# Patient Record
Sex: Female | Born: 1990 | Race: Black or African American | Hispanic: No | Marital: Single
Health system: Midwestern US, Community
[De-identification: ages and names within clinical notes are randomized; demographics above are authoritative.]

## PROBLEM LIST (undated history)

## (undated) DIAGNOSIS — Z789 Other specified health status: Secondary | ICD-10-CM

---

## 2016-08-04 ENCOUNTER — Inpatient Hospital Stay: Admit: 2016-08-04 | Discharge: 2016-08-04 | Disposition: A | Discharge status: Home / Self Care

## 2016-08-04 DIAGNOSIS — Z202 Contact with and (suspected) exposure to infections with a predominantly sexual mode of transmission: Secondary | ICD-10-CM

## 2016-08-04 LAB — WET PREP, GENITAL
Clue Cells, Wet Prep: NONE SEEN
Trichomonas Prep: NONE SEEN
Yeast, Wet Prep: NONE SEEN

## 2016-08-04 LAB — URINALYSIS WITH REFLEX TO CULTURE
Bilirubin Urine: NEGATIVE
Glucose, Ur: NEGATIVE mg/dL
Ketones, Urine: NEGATIVE mg/dL
Nitrite, Urine: NEGATIVE
Protein, UA: 30 mg/dL — AB
Specific Gravity, UA: 1.023 (ref 1.005–1.030)
Urobilinogen, Urine: 1 E.U./dL (ref <2.0)
pH, UA: 6.5 (ref 5.0–8.0)

## 2016-08-04 LAB — C.TRACHOMATIS N.GONORRHOEAE DNA
C. trachomatis DNA: NEGATIVE
N. gonorrhoeae DNA: NEGATIVE

## 2016-08-04 LAB — MICROSCOPIC URINALYSIS
Epithelial Cells, UA: 6 /HPF — ABNORMAL HIGH (ref 0–5)
Hyaline Casts, UA: 12 /LPF — ABNORMAL HIGH (ref 0–8)
RBC, UA: 12 /HPF — ABNORMAL HIGH (ref 0–4)
WBC, UA: 33 /HPF — ABNORMAL HIGH (ref 0–5)

## 2016-08-04 LAB — PREGNANCY, URINE: HCG(Urine) Pregnancy Test: NEGATIVE

## 2016-08-04 MED ORDER — CEFTRIAXONE SODIUM 250 MG IJ SOLR
250 MG | Freq: Once | INTRAMUSCULAR | Status: AC
Start: 2016-08-04 — End: 2016-08-04
  Administered 2016-08-04: 14:00:00 250 mg via INTRAMUSCULAR

## 2016-08-04 MED ORDER — AZITHROMYCIN 500 MG PO TABS
500 MG | Freq: Once | ORAL | Status: AC
Start: 2016-08-04 — End: 2016-08-04
  Administered 2016-08-04: 14:00:00 1000 mg via ORAL

## 2016-08-04 MED FILL — CEFTRIAXONE SODIUM 250 MG IJ SOLR: 250 MG | INTRAMUSCULAR | Qty: 250

## 2016-08-04 MED FILL — AZITHROMYCIN 500 MG PO TABS: 500 MG | ORAL | Qty: 2

## 2016-08-04 NOTE — Discharge Instructions (Signed)
STD Clinics  FOR MORE INFORMATION ABOUT STD?S, CONTACT YOUR PHYSICIAN OR:    Sexually Transmitted Disease [facility]                              [facility]                              [address]                               [place], [place]  [id number]                    [phone]    Sexually Transmitted Disease Clinic                              [facility]                              [address]                     [place], [place]  [id number]                    [phone]    [facility]                  [address]                   [place], [place]  [id number]                                           [phone]  [phone]                      [place] AIDS Hotline     [phone]               [facility] Referral number [phone] for Primary Care      [facility]  [facility]  [address] [address]  [phone]  Fax [phone]  Medical, OB/Gyn, Pediatrics, WIC  Serves all of [facility])  [address]  [place], [place] [address]  [phone]       [facility]  [address] (Administrative offices)  [phone]  Homeless only [facility])  [address], [place], [place] [address]  [phone] or [phone], Spanish [phone],   Dental Appointments [phone] or [phone]  Pediatric, Family Practice, X-Ray  Serves all of [facility]     [facility])  [address]  [place], [place] [address]  [phone]   Health Care Connection [phone](Mt. Healthy)   [address]    (Located in [facility])  [phone] or [phone], Spanish [phone],   Dental Appointments [phone] or [phone]     [facility]  [address]  [place], [place] [address]  [phone] [facility]  [address]  [phone]   [facility]  [address]  [id number]  [phone] Fax [phone]  General Practice    [facility] [place] and Surrounding areas [facility]  [address] [address]  [phone] Fax [phone]  Medical, OB/Gyn, Pediatrics  Dental Clinic, [facility] [place] limits only     [facility]  [address] [address]  [phone] Fax [phone]  [facility], Pediatrics, [facility]OB/Gyn, [facility]  Dental Clinic [phone]  [facility] [place] area   [facility]  [address]    [phone]  Urgent Care, Open 24 hrs, Urgent care, Gyn, Prenatal, Dental Mental, Translators     [facility]   [address] [place], [place] [address] [phone]  (Located: [facility])  Pediatrics [phone], Spanish [phone],   Dental Appointments [phone] or [phone]  [facility] [phone] [facility]  [address] [address]  [phone]  Medical, OB/Gyn, Pediatrics, [facility]  Dental Clinic [phone]       [facility]  [address], [place], [place]  [address]  [phone] Fax [phone]  Pediatrics, [facility]   [facility]  [address] [id number]  [phone]   Fax [phone]  Pediatrics, [facility]     [facility]  [address] [address]  [phone]  Medical Clinic   [facility]  [address] [address]  [phone]  Pediatrics, Internal Med, [facility]oOB/Gyn, [facility], Dental Clinic [phone]     [facility]  [address]  [place] [address]   [phone]  [facility]  [address] [address]  [phone] Fax [phone]  General Medical Clinic  Sliding scale fee  All [place] area     HOSPITAL CLINICS    [facility]  [address]  [place], [place] [id number]  [phone]     [facility]  [address]  [place], [place] [address]  [phone]    [facility]   [address]  [place], [place] [address]  [phone]         [facility]  [address]  [place], [place] [address]  [facility]  [phone]  Fax:  [phone]  [facility] and Pediatrics  [phone]  Fax:  [phone]  General Medical Clinic  Resident Practice  [phone]  Fax:  [phone]  Medical Specialty Center  [phone]  Fax:  [phone]  Orthopaedics  [phone]     [facility]    [facility] [place])  [address]  [place], [place] [address]  [phone]    [place]ella GroveLER[place]ONT (Continued)    [facility]   [facility] [place])  [address]  [place], [place] [address]  [phone]   [facility]    [facility] [place]   (formerly [facility])   [address]  [place], [place] [address]  [phone]  [phone] OB/GYN Services   [facility]   [facility] Source [facility] [place])  [address]  [place], [place] [address]  [phone]       [facility]  [facility] [place])  [phone] [place]. [address]  [place], [place] [address]  [phone] Clarisa SchoolsFAYETTE COUNTY    [facility]  ([facility] [place])  [address]  [place], [place] [address]  [phone]     [facility]  [facility] Source of [place])  [address]  [place], [place] [address]  [phone]   [facility]    [place] [facility] ([facility] Source [facility] [place])  [address]  [place], [place] [address]  [phone]    [facility]  [facility]  [address], [place], [place] [address]  [phone]  General Family Practice, Pediatrics  Services all areas sliding scale fee     [facility]  Pleas PatriciaWar[place] Co. Health Department  [address] [place], [place]  [address]  [phone]  General Medical Clinic, Pediatrics, [facility]WIC   [facility]   (formerly [facility])  [address]  [place], [place] [id number]  [phone]   [facility] (formerly Micron TechnologyM[facility]let[facility] Social [facility])  [address]  [place], [place] [address]  [phone]     [place]  [facility] ([facility] Source [facility] [place])  [address]   [place] [facility]  [phone]  Gyn, Prenatal, Dental, Mental, Translators [place]  [facility]  [place]   [phone]         - Abstain from intercourse for at least 2 weeks  - Follow up with [facility] for blood work   - Return to the ED with any new or worrisome symptoms

## 2016-08-04 NOTE — ED Notes (Signed)
Pt signed and dc instructions given. Pt was in hurry and unable to listen to RN directions. Pt dc at this time.      Charleston Ropesrystal Cinthia Rodden, RN  08/04/16 1010

## 2016-08-04 NOTE — Progress Notes (Signed)
Culture Result reviewed, no further treatment needed.

## 2016-08-04 NOTE — Progress Notes (Signed)
Culture result reviewed, no further treatment needed.

## 2016-08-04 NOTE — ED Provider Notes (Signed)
[facility] EMERGENCY DEPT  [address] [place] [address]  Dept: [phone]  Loc: [phone]    eMERGENCY dEPARTMENT eNCOUnter        CHIEF COMPLAINT    Chief Complaint   Patient presents with   . Pregnancy Test   . Exposure to STD     no symptoms, exposed but unknown       HPI    Katelyn Castaneda is a 26 y.o. female who presents with concern for possible STD and requesting a pregnancy test.  She has no symptoms.  She states she is here because her significant other is here and is being tested as well.  She denies any abdominal pain, vaginal discharge.  She denies any sores or rashes.  She denies any fever or chills.  She has no further complaints at this time.    REVIEW OF SYSTEMS    General: No fevers  GU: no dysuria, no GU discharge  GI: No vomiting, no abdominal pain  Skin: No new rashes  Musculoskeletal: No arthralgia    PAST MEDICAL & SURGICAL HISTORY    History reviewed. No pertinent past medical history.  Past Surgical History:   Procedure Laterality Date   . CESAREAN SECTION         CURRENT MEDICATIONS  (may include discharge medications prescribed in the ED)      ALLERGIES    No Known Allergies    FAMILY AND SOCIAL HISTORY    History reviewed. No pertinent family history.  Social History     Social History   . Marital status: Single     Spouse name: N/A   . Number of children: N/A   . Years of education: N/A     Social History Main Topics   . Smoking status: Never Smoker   . Smokeless tobacco: None   . Alcohol use Yes      Comment: social   . Drug use: No   . Sexual activity: Yes     Other Topics Concern   . None     Social History Narrative   . None       PHYSICAL EXAM    VITAL SIGNS: BP 115/66   Pulse 81   Temp 98.3 F (36.8 C) (Oral)   Resp 14   Ht 4\' 6"  (1.372 m)   Wt 146 lb 13.2 oz (66.6 kg)   LMP 07/08/2016   SpO2 100%   BMI 35.40 kg/m    Constitutional:  Well-developed, appears comfortable  HENT:  Atraumatic, external nose normal  NECK:  Supple  Respiratory:  No respiratory distress  Cardiovascular: no JVD  GI:  Abdomen is non-distended,  no abdominal tenderness  Lymphatic: No bubo or inguinal lymphadenopathy  GU:  Pelvic exam performed with chaperone reveals: Normally developed external genitalia with no cutaneous or mucosal lesions or vesicles, normal appearing cervix, with some white discharge in the vaginal vault, no cervical motion tenderness, os is closed and no active bleeding from the cervix  Back: no CVA tenderness  Integument:  Nondiaphoretic skin, no obvious rashes  Musculoskeletal: No obvious joint swelling or limitations of joints range of motion  Neurologic: Awake and oriented, no slurred speech    ED COURSE & MEDICAL DECISION MAKING    See chart for details of medications given.    Differential diagnosis: UTI, Pyelonephritis, Chlamydia/Gonorrhea, Bacterial Vaginosis, Yeast vaginitis, Trichomonas, Herpes genitalia, Venereal Warts (condyloma acuminata), Syphilis, HIV/AIDS, Chancroid (Haemophilus ducreyi), Lymphogranuloma venereum, Granuloma  inguinale    Patient is afebrile and nontoxic in appearance.  No evidence of PID on pelvic exam.  She was treated in the ED empirically with Rocephin and Zithromax.  Wet prep negative, negative for trichomonas.  Urinalysis with no evidence for acute infection -the leukocytes and white blood cells are likely due to the vaginal discharge and potential STD, we will see what the culture shows.  She is not pregnant.      I instructed the patient to follow up as an outpatient in 2 days.  I instructed the patient to have an outpatient HIV and Syphilis test, to be ordered by the follow-up doctor or at a local clinic.  I will provide a list of local clinics with the discharge instructions.  I instructed the patient not to have sex for 2 weeks.  I instructed the patient to return to the ED immediately for any new or worsening symptoms.  The patient verbalizes understanding.    This was an independent mid-level  encounter.     FINAL IMPRESSION    1. Possible exposure to STD        PLAN  Discharge with outpatient follow-up    (Please note that this note was completed with a voice recognition program.  Every attempt was made to edit the dictations, but inevitably there remain words that are mis-transcribed.)         Burman FreestoneBrook M Tabor Bartram, [place]  08/04/16 [phone]

## 2016-08-04 NOTE — ED Triage Notes (Signed)
Pt presented to ED d/t exposure of unknown STD. Pt states she has no symptoms. Pt also presented to ED for pregnancy test.

## 2016-08-05 LAB — CULTURE, URINE: Urine Culture, Routine: >50000

## 2019-04-28 HISTORY — PX: CHOLECYSTECTOMY: SHX55

## 2020-11-08 ENCOUNTER — Inpatient Hospital Stay (HOSPITAL_COMMUNITY)
Admission: AD | Admit: 2020-11-08 | Discharge: 2020-11-08 | Disposition: A | Discharge status: Home / Self Care | Payer: Medicaid Other | Attending: Family Medicine | Admitting: Family Medicine

## 2020-11-08 ENCOUNTER — Other Ambulatory Visit: Payer: Self-pay

## 2020-11-08 ENCOUNTER — Inpatient Hospital Stay (HOSPITAL_COMMUNITY): Payer: Medicaid Other

## 2020-11-08 ENCOUNTER — Encounter (HOSPITAL_COMMUNITY): Payer: Self-pay | Admitting: Family Medicine

## 2020-11-08 DIAGNOSIS — O209 Hemorrhage in early pregnancy, unspecified: Secondary | ICD-10-CM

## 2020-11-08 DIAGNOSIS — O200 Threatened abortion: Secondary | ICD-10-CM | POA: Insufficient documentation

## 2020-11-08 DIAGNOSIS — Z3A00 Weeks of gestation of pregnancy not specified: Secondary | ICD-10-CM | POA: Diagnosis not present

## 2020-11-08 DIAGNOSIS — Z3A01 Less than 8 weeks gestation of pregnancy: Secondary | ICD-10-CM | POA: Diagnosis not present

## 2020-11-08 LAB — URINALYSIS, ROUTINE W REFLEX MICROSCOPIC
Bilirubin Urine: NEGATIVE
Glucose, UA: NEGATIVE mg/dL
Hgb urine dipstick: NEGATIVE
Ketones, ur: NEGATIVE mg/dL
Leukocytes,Ua: NEGATIVE
Nitrite: NEGATIVE
Protein, ur: NEGATIVE mg/dL
Specific Gravity, Urine: 1.02 (ref 1.005–1.030)
pH: 6 (ref 5.0–8.0)

## 2020-11-08 LAB — CBC
HCT: 39.3 % (ref 36.0–46.0)
Hemoglobin: 13.1 g/dL (ref 12.0–15.0)
MCH: 31.1 pg (ref 26.0–34.0)
MCHC: 33.3 g/dL (ref 30.0–36.0)
MCV: 93.3 fL (ref 80.0–100.0)
Platelets: 278 10*3/uL (ref 150–400)
RBC: 4.21 MIL/uL (ref 3.87–5.11)
RDW: 13.9 % (ref 11.5–15.5)
WBC: 7.1 10*3/uL (ref 4.0–10.5)
nRBC: 0 % (ref 0.0–0.2)

## 2020-11-08 LAB — HCG, QUANTITATIVE, PREGNANCY: hCG, Beta Chain, Quant, S: 16909 m[IU]/mL — ABNORMAL HIGH (ref <5)

## 2020-11-08 LAB — ABO/RH: ABO/RH(D): A POS

## 2020-11-08 LAB — POCT PREGNANCY, URINE: Preg Test, Ur: POSITIVE — AB

## 2020-11-08 NOTE — MAU Note (Signed)
Hailey Watson is a 30 y.o. at [redacted]w[redacted]d here in MAU reporting: pt arrived via EMS complaining of abdominal pain. States the pain started after eating ginger cookies and milk yesterday. Pain is intermittent. Started having some vaginal bleeding this AM. States it was pink and she saw it when she wiped.   LMP: 10/01/20  Onset of complaint: yesterday  Pain score: 6/10  Vitals:   11/08/20 1014  BP: 115/69  Pulse: 78  Resp: 16  Temp: 98.9 F (37.2 C)  SpO2: 100%     Lab orders placed from triage: UA, UPT

## 2020-11-08 NOTE — MAU Provider Note (Signed)
   S Ms. Hailey Watson is a 30 y.o. G1P0 patient who presents to MAU today with complaint of cramping and light spotting that started earlier today and is now resolved.   O BP 115/69 (BP Location: Right Arm)   Pulse 78   Temp 98.9 F (37.2 C) (Oral)   Resp 16   Ht 4\' 6"  (1.372 m)   Wt 72.3 kg   LMP 10/01/2020 (Approximate)   SpO2 100% Comment: room air  BMI 38.43 kg/m  Physical Exam Vitals reviewed.  Constitutional:      Appearance: She is well-developed.  HENT:     Head: Normocephalic and atraumatic.  Cardiovascular:     Rate and Rhythm: Normal rate and regular rhythm.  Abdominal:     General: Abdomen is flat. Bowel sounds are increased.     Palpations: Abdomen is rigid.     Tenderness: There is no abdominal tenderness. There is no guarding or rebound.  Neurological:     Mental Status: She is alert.    A Medical screening exam complete 1. Threatened miscarriage   2. First trimester bleeding      P Discharge from MAU in stable condition F/u 12/02/2020 in 10 days. Warning signs for worsening condition that would warrant emergency follow-up discussed Patient may return to MAU as needed   [place], DO 11/08/2020 12:17 PM

## 2020-11-18 ENCOUNTER — Inpatient Hospital Stay: Admission: RE | Admit: 2020-11-18 | Payer: Medicaid Other | Source: Ambulatory Visit

## 2020-11-30 ENCOUNTER — Other Ambulatory Visit: Payer: Self-pay

## 2020-11-30 ENCOUNTER — Telehealth: Payer: Self-pay | Admitting: Obstetrics and Gynecology

## 2020-11-30 ENCOUNTER — Ambulatory Visit
Admission: RE | Admit: 2020-11-30 | Discharge: 2020-11-30 | Disposition: A | Discharge status: Home / Self Care | Payer: Medicaid Other | Source: Ambulatory Visit | Attending: Family Medicine | Admitting: Family Medicine

## 2020-11-30 DIAGNOSIS — O209 Hemorrhage in early pregnancy, unspecified: Secondary | ICD-10-CM | POA: Insufficient documentation

## 2020-11-30 NOTE — Telephone Encounter (Signed)
Attempted to call patient to discuss recent US results. No answer and no option to leave VM.     Victorino Dike, NP

## 2020-12-22 ENCOUNTER — Inpatient Hospital Stay (HOSPITAL_COMMUNITY): Payer: Medicaid Other

## 2020-12-22 ENCOUNTER — Inpatient Hospital Stay (HOSPITAL_COMMUNITY)
Admission: AD | Admit: 2020-12-22 | Discharge: 2020-12-22 | Disposition: A | Discharge status: Home / Self Care | Payer: Medicaid Other | Attending: Obstetrics & Gynecology | Admitting: Obstetrics & Gynecology

## 2020-12-22 ENCOUNTER — Other Ambulatory Visit: Payer: Self-pay

## 2020-12-22 ENCOUNTER — Encounter (HOSPITAL_COMMUNITY): Payer: Self-pay | Admitting: Obstetrics & Gynecology

## 2020-12-22 DIAGNOSIS — O26891 Other specified pregnancy related conditions, first trimester: Secondary | ICD-10-CM | POA: Diagnosis not present

## 2020-12-22 DIAGNOSIS — R1011 Right upper quadrant pain: Secondary | ICD-10-CM

## 2020-12-22 DIAGNOSIS — Z3A11 11 weeks gestation of pregnancy: Secondary | ICD-10-CM

## 2020-12-22 DIAGNOSIS — R1031 Right lower quadrant pain: Secondary | ICD-10-CM | POA: Insufficient documentation

## 2020-12-22 DIAGNOSIS — M7918 Myalgia, other site: Secondary | ICD-10-CM

## 2020-12-22 DIAGNOSIS — N949 Unspecified condition associated with female genital organs and menstrual cycle: Secondary | ICD-10-CM

## 2020-12-22 HISTORY — DX: Other specified health status: Z78.9

## 2020-12-22 LAB — URINALYSIS, ROUTINE W REFLEX MICROSCOPIC
Bilirubin Urine: NEGATIVE
Glucose, UA: NEGATIVE mg/dL
Hgb urine dipstick: NEGATIVE
Ketones, ur: NEGATIVE mg/dL
Nitrite: NEGATIVE
Protein, ur: NEGATIVE mg/dL
Specific Gravity, Urine: 1.023 (ref 1.005–1.030)
Squamous Epithelial / HPF: >50 — ABNORMAL HIGH (ref 0–5)
pH: 7 (ref 5.0–8.0)

## 2020-12-22 NOTE — Discharge Instructions (Signed)
Source: [facility]  Seven Things to Know About the Pain from Pubic Symphysis By: Jeananne Rama, PT, DPT     Today we welcome guest writer, Jeananne Rama, PT,  DPT, a physical therapist specializing in pelvic floor health for the childbearing year.  I am grateful that the topic of pelvic floor health during pregnancy and postpartum is being discussed, as many people suffer in silence and without helpful resources. Greggory Stallion,  Medical sales representative, [employer]  Introduction Joint pain is a very common physical challenge resulting from pregnancy. In particular, pelvic joint pain affects 16 to 25% of pregnancies, with onset anywhere from the first to third trimester (Kanakaris, 2011). This article will give you answers to the seven most common questions childbirth educators get about pain in the pubic symphysis--the joint at the very front of the pelvis that expands as pregnancy progresses.    1. What does pubic symphysis pain feel like? Sometimes pubic symphysis pain can feel like a slight pinch or ache. Other times it hurts so much someone will not want to walk. In certain cases, the pain will not be over the pubic symphysis, but in the creases of the groins or along the inner thighs. Occasionally, pain is only felt on one side of the body. Pubic symphysis joint pain is commonly provoked by moving the legs apart, such as getting in and out of a car, climbing out of bed, rolling in bed, or going up and down stairs. Standing up from prolonged sitting, particularly on a soft couch, is another known trigger.  2. Do I need to tell my midwife or doctor about my pain? Yes! Any time you have notable pain, you should be directed to a knowledgeable medical provider for diagnosis. The medical term for problematic pubic symphysis pain is symphysis pubis dysfunction (SPD).  3. Is pubic symphysis pain treatable? Yes! There are a number of at-home treatment options including rest, bracing, and following  self-help tips. Many people with pubic symphysis joint pain in pregnancy also benefit from physical therapy, massage therapy, acupuncture, or chiropractic care. Here's a list of self-help tips:   Walking: Walk with shorter steps to decrease pubic symphysis irritation. When carrying items, wear them in a backpack or hold them close to the chest to avoid uneven loading of the pelvis causing pain.    Sleep: Keep a pillow between the legs that spans from the knees to the ankles (not just between the knees).   Rolling over in bed: To go from one side to the other, (1) bend knees to 90 degrees, (2) tense the belly gently for stability (sometimes doing a Kegel helps too!), and (3) roll with knees, hips, and shoulders in line like a log. Avoid flopping side to side in bed or letting the spine twist.   Dressing: Sit down on the edge of the bed or on a chair to get dressed. Standing on one leg can irritate the pubic symphysis.   Stairs: Face the railing and try going up stairs stepping to the side. Alternatively, take the steps one at a time to avoid a large movement with the pelvis.   Car: When getting in a car, turn their back to the seat and aim their bottom in. Once seated, keep the knees close together as they slowly turn to face forward. Getting out is the same process in reverse. Keeping the knees close together helps prevent the pubic symphysis from feeling pulled on.   Sitting: Rest as evenly as  possible on their sit bones. Consider putting a pillow behind the back for support. Avoid slumping or leaning on one sit bone more than the other. Sitting on a large exercise ball (about 65 cm) may offer relief of pain. Avoid sitting cross legged on the floor.   Sexual activities: Aim to keep the knees together as much as possible. This may require some creativity for positioning (think spooning or using a chair).   4. Are support belts a good idea?  Most, but not all, people with SPD feel less pain and  have an easier time walking while wearing a support belt. One of the most common support belts for SPD treatment is the Serola Sacroiliac Belt. Another common support belt is the Upsie Belly. Many support belts can be purchased using a Health Savings Account (HSA) or Flexible Health Savings (FSA). Some people use a support belt just when they need more support, such as for a walk around the neighborhood. Other people wear the belt 23 hours per day, just taking it off to shower. Many people take them off for sitting. Sometimes people find that they need help from a physical therapist or chiropractor to adjust the pubic bones before splinting them with a brace.   5. What should I expect during birth? It is safe to labor with pain in the pubic symphysis joints, and this pain does not change the physiological process of birth. However, many birthing people find it helpful to adjust their positioning in labor and delivery to manage discomfort in the pubic symphysis joint. While going through contractions, positions that take weight off the pelvis may feel better. These positions include resting the upper body on an exercise ball, slow dance, being in a tub or pool, and sidelying. During pushing, hands and knees and sidelying are frequently more comfortable positions. Common positions to avoid if possible include one or both knees to chest, lunging, and climbing stairs.   6. What should I expect after I give birth?  Right after birth, the pubic symphysis pain may suddenly go away or it may feel worse from the physical rigors of labor. The first few days postpartum are often the most challenging, and a walker or wheelchair may be needed temporarily. Wearing a support belt is often helpful. If SPD is treated, most people get back to an active lifestyle by three to four months postpartum.   7. What about a future pregnancy? Joint pain that occurs during one pregnancy is worse during a subsequent pregnancy, unless  the underlying risk factors are addressed (Kanakaris, 2011). Early use of interventions to manage pain, such as a support belt or targeted exercise, can lead to a more satisfying pregnancy experience overall. On occasion, a person who experienced a traumatic pubic symphysis injury during birth will choose an elective cesarean for the following birth. A cesarean is not required in these cases, but it can help the birthing person to feel more in control and to avoid a triggering experience.     Resources Websites   [facility] (https://www.babycentre.co.uk/a546492/pelvic-pain-in-pregnancy-spd)  [place]'s National Health Service (https://www.nhs.uk/pregnancy/related-conditions/common-symptoms/pelvic-pain/)  At [facility], we have pelvic floor/female physical therapy specialists available. Ask your midwife or doctor.    Or here are other sites to search for therapists:   American [employer] Therapy Associations (https://ptl.womenshealthapta.org).   Global Pelvic Health Alliance (https://pelvicguru.com)  Kanakaris, N. K., Roberts, C. S., & Giannoudis, P. V. (2011). Pregnancy-related pelvic girdle pain: an update. BMC Medicine, 9(15). https://doi.org/10.1186/1741-7013-12-09  So  

## 2020-12-22 NOTE — MAU Provider Note (Addendum)
History    [id number]  Arrival date and time: 12/22/20 6734   Chief Complaint  Patient presents with   Abdominal Pain    HPI Hailey Watson is a 30 y.o. at [redacted]w[redacted]d by U/S with unremarkable PMHx, who presents for RLQ. Pt states the pain started on Friday and has come and gone. She describes it as a sharp pain, as if "someone was pulling on her". Rates it as an 8/10. She has never had this pain before. She is currently not taking any pain meds or prenatal. The pain can last up to 4-5 hours. Pain is exacerbated by rolling over in bed, getting up from an low-sitting chair, laying on her R side, and flexing her R hip. She endorses walking with a R limp. No discomfort on her L side. Improvement with laying on her L side.  Denies fever, discharge, VB, dysuria, N/V, and hx of UTI/STD.  Pt works as a Tax inspector    Vaginal bleeding: No LOF: No Fetal Movement: No Contractions: No  --/--/A POS (08/15 1044)  OB History     Gravida  5   Para  2   Term  2   Preterm      AB  2   Living  2      SAB  2   IAB      Ectopic      Multiple      Live Births  2        Obstetric Comments  Never dilated, "shallow" cervix         Past Medical History:  Diagnosis Date   Medical history non-contributory     Past Surgical History:  Procedure Laterality Date   CESAREAN SECTION     CHOLECYSTECTOMY  04/2019   Allergies: NKDA   Family History  Problem Relation Age of Onset   Healthy Mother    Cancer Father     Social History   Socioeconomic History   Marital status: Single    Spouse name: Not on file   Number of children: Not on file   Years of education: Not on file   Highest education level: Not on file  Occupational History   Not on file  Tobacco Use   Smoking status: Never   Smokeless tobacco: Never  Vaping Use   Vaping Use: Never used  Substance and Sexual Activity   Alcohol use: Never   Drug use: Not Currently    Types: Marijuana     Comment: 1-2/month   Sexual activity: Not Currently  Other Topics Concern   Not on file  Social History Narrative   Not on file   Social Determinants of Health   Financial Resource Strain: Not on file  Food Insecurity: Not on file  Transportation Needs: Not on file  Physical Activity: Not on file  Stress: Not on file  Social Connections: Not on file  Intimate Partner Violence: Not on file   No current facility-administered medications on file prior to encounter.   No current outpatient medications on file prior to encounter.   Review of Systems  Constitutional:  Negative for fever.  Gastrointestinal:  Positive for abdominal pain. Negative for vomiting.  Genitourinary:  Negative for dysuria.  Musculoskeletal:  Positive for back pain.  Neurological:  Positive for headaches.   Physical Exam   BP 116/63 (BP Location: Right Arm)   Pulse 89   Temp 98.2 F (36.8 C) (Oral)   Resp 18   Ht 4'  6" (1.372 m)   Wt 74.3 kg   LMP 10/01/2020 (Approximate)   SpO2 100%   BMI 39.52 kg/m   Patient Vitals for the past 24 hrs:  BP Temp Temp src Pulse Resp SpO2 Height Weight  12/22/20 0923 116/63 98.2 F (36.8 C) Oral 89 18 100 % 4\' 6"  (1.372 m) 74.3 kg    Physical Exam Constitutional:      Appearance: She is well-developed.  HENT:     Head: Normocephalic and atraumatic.  Eyes:     Extraocular Movements: Extraocular movements intact.  Pulmonary:     Effort: Pulmonary effort is normal.  Abdominal:     Palpations: Abdomen is soft. There is no mass.     Tenderness: There is abdominal tenderness in the right lower quadrant and suprapubic area. There is no right CVA tenderness or left CVA tenderness.  Skin:    General: Skin is warm and dry.  Neurological:     Mental Status: She is alert.   Labs No results found for this or any previous visit (from the past 24 hour(s)).  Imaging  PELVIC COMPLETE W TRANSVAGINAL AND TORSION R/O    (Results Pending)  US PELVIC COMPLETE W  TRANSVAGINAL AND TORSION R/O    (Results Pending)   MAU Course  Procedures Lab Orders         Urinalysis, Routine w reflex microscopic Urine, Clean Catch     Imaging Orders         US PELVIC COMPLETE W TRANSVAGINAL AND TORSION R/O         US PELVIC COMPLETE W TRANSVAGINAL AND TORSION R/O     MDM moderate  Assessment and Plan  Hailey Watson is a 30 y.o. at [redacted]w[redacted]d by U/S with unremarkable PMHx, who presents for RLQ. Pt endorses RLQ with radiation to her R sacrum. Pt appeared to have R limp when asked to stand and walk. She is asymptomatic. VSS. UA lab is pending.   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 Less likely pt has appendicitis or ovarian torsion given lack of fever and constant, excruciating pain.   -Obtain U/S to rule out ovarian torsion or cyst  -Consider PRN Tylenol   [redacted]w[redacted]d, Medical Student 12/22/20 11:46 AM   CNM attestation:  I have seen and examined this patient and agree with above documentation in the medical student's note.   Hailey Watson is a 30 y.o. 32 at [redacted]w[redacted]d reporting pain in her pubic symphsis area, radiating to her sacrum. Is worse when walking, changing positions.  +FM, denies LOF, VB, contractions, vaginal discharge.  PE: Patient Vitals for the past 24 hrs:  BP Temp Temp src Pulse Resp SpO2 Height Weight  12/22/20 0923 116/63 98.2 F (36.8 C) Oral 89 18 100 % 4\' 6"  (1.372 m) 74.3 kg   Gen: calm comfortable, NAD Resp: normal effort, no distress Heart: Regular rate Abd: Soft, NT, gravid, S=D  FHR: 152 by Doppler   ROS, labs, PMH reviewed  Orders Placed This Encounter  Procedures   12/24/20 PELVIC COMPLETE W TRANSVAGINAL AND TORSION R/O   Urinalysis, Routine w reflex microscopic Urine, Clean Catch   Ambulatory referral to Physical Therapy   Discharge patient Discharge disposition: 01-Home or  Self Care; Discharge patient date: 12/22/2020   No orders of the defined types were placed in this encounter.  MDM -Patient [place] is normal in MAU; no evidence of ovarian torsion or concerning findings.   Assessment: 1. Round ligament pain   2. RLQ abdominal pain   3. Pain in symphysis pubis during pregnancy     Plan: - Discharge home in stable condition; discussed management measures for pubis symphsis pain and referral was made to Ambulatory PT -RX for Flexeril given -GC CT pending; wet prep pending - Follow-up as scheduled at your doctor's office for next prenatal visit or sooner as needed if symptoms worsen. - Return to maternity admissions symptoms worsen  Marylene Land, CNM 12/22/2020 1:14 PM

## 2020-12-22 NOTE — MAU Note (Signed)
For the last 3 days, pain in RLQ up abd.  This morning, felt like someone had pulled something or she was stabbed.  Is very uncomfortable.

## 2021-01-13 ENCOUNTER — Telehealth (INDEPENDENT_AMBULATORY_CARE_PROVIDER_SITE_OTHER): Payer: Medicaid Other

## 2021-01-13 DIAGNOSIS — Z136 Encounter for screening for cardiovascular disorders: Secondary | ICD-10-CM

## 2021-01-13 DIAGNOSIS — Z5941 Food insecurity: Secondary | ICD-10-CM

## 2021-01-13 DIAGNOSIS — Z3A00 Weeks of gestation of pregnancy not specified: Secondary | ICD-10-CM

## 2021-01-13 DIAGNOSIS — Z3480 Encounter for supervision of other normal pregnancy, unspecified trimester: Secondary | ICD-10-CM

## 2021-01-13 MED ORDER — BLOOD PRESSURE MONITORING DEVI: 1.0000 | 0 refills | Status: AC

## 2021-01-13 MED ORDER — PRENATAL PLUS 27-1 MG PO TABS: 1.0000 | ORAL_TABLET | Freq: Every day | ORAL | 11 refills | Status: DC

## 2021-01-13 NOTE — Patient Instructions (Signed)
AREA PEDIATRIC/FAMILY PRACTICE PHYSICIANS  Central/Southeast [place] ([address]) [facility] Chambliss, MD; Eniola, MD; Hale, MD; Hensel, MD; McDiarmid, MD; McIntyer, MD; Shriyan Arakawa, MD; Walden, MD [address] [place], [place] [address] [phone] Mon-Fri 8:30-12:30, 1:30-5:00 Providers come to see babies at [facility] Accepting Medicaid [facility] providers who accept newborns: Koirala, MD; Morrow, MD; Wolters, MD [address], [place], [place] [address] [phone] Mon-Fri 8:00-5:30 Babies seen by providers at [facility] Does NOT accept Medicaid Please call early in hospitalization for appointment (limited availability)  [facility] Mulberry, MD [address] [place], [place] [address] [phone] Mon, Tue, Thur, Fri 8:30-5:00, Wed 10:00-7:00 (closed 1-2pm) Babies seen by [facility] providers Accepting Medicaid Rubin - Pediatrician Rubin, MD [address], [place], [place] [address] [phone] Mon-Fri 8:30-5:00, Sat 8:30-12:00 Provider comes to see babies at [facility] Accepting Medicaid Must have been referred from current patients or contacted office prior to delivery [facility] ([facility]) Brown, MD; Chandler, MD; Ettefagh, MD; Grant, MD; Lester, MD; McCormick, MD; McQueen, MD; Prose, MD; Simha, MD; Stanley, MD; Stryffeler, NP; Tebben, NP [address], [place], [place] [address] [phone] Mon, Tue, Thur, Fri 8:30-5:30, Wed 9:30-5:30, Sat 8:30-12:30 Babies seen by [facility] providers Accepting Medicaid Only accepting infants of first-time parents or siblings of current patients Hospital discharge coordinator will make follow-up appointment Jack Amos [address], [place], [place]  [address] [phone]   Fax - [phone] [facility] [address], [place], [place]  [address] Phone - [phone]   Fax - [phone] Shilpa Gosrani [address], [place], [place]  [address] [phone]  East/[place]ortheast [place] ([address]) [facility] Bates, MD; Brassfield, MD; Cooper, Cox, MD; MD; Davis, MD; Dovico, MD; Ettefaugh, MD; Little, MD; Lowe, MD; Keiffer, MD; Melvin, MD; Sumner, MD; Williams, MD [address], [place], [place] [address] [phone] Mon-Fri 8:30-5:00 (extended evenings Mon-Thur as needed), Sat-Sun 10:00-1:00 Providers come to see babies at [facility] Accepting Medicaid for families of first-time babies and families with all children in the household age 3 and under. Must register with office prior to making appointment (M-F only). [facility] Henson, NP; Knapp, MD; Lalonde, MD; Tysinger, PA [address] [place], [place] [address] [phone] Mon-Fri 8:00-5:00 Babies seen by providers at [facility] Does NOT accept Medicaid/Commercial Insurance Only [facility] ([facility])  Artis, MD; Barnes, MD; Bratton, MD; Coccaro, MD; [place] Gardner, MD; Kramer, MD; Marshall, MD; Netherton, MD; Poleto, MD; Skinner, MD [address] [place], [place] [address] [phone] Mon-Fri 8:30-5:30, Sat (Oct.-Mar.) 9:00-1:00 Babies seen by providers at [facility] Accepting Medicaid  [place] ([address]) [facility] Reid, MD; Warner, MD [address], [place], [place] [address] [phone] Mon-Fri 8:30-5:00, Sat 8:30-12:00 Providers come to see babies at [facility] Does NOT accept Medicaid [facility] Becker, PA; Hagler, MD; Scifres, PA; Sun, MD; Swayne, MD [address], [place], [place] [address] [phone] Mon-Fri 8:00-5:00 Babies seen by providers at [facility] Does NOT accept Medicaid Only accepting babies of parents who  are patients Please call early in hospitalization for appointment (limited availability) [facility] Clark, MD; Frye, MD; Kelleher, MD; Mack, NP; Miller, MD; O'Keller, MD; Patterson, NP; Pudlo, MD; Puzio, MD; Thomas, MD; Tucker, MD; Twiselton, MD [address], [place], [place] [address] [phone] Mon-Fri 8:00-5:00, Sat 9:00-12:00 Providers come to see babies at [facility] Does NOT accept Medicaid  [place] ([address]) [facility] providers accepting new patients: Brake, NP; Wharton, PA [address], [place], [place] [address] [phone] Mon-Fri 8:00-5:00 Babies seen by providers at [facility] Does NOT accept Medicaid Only accepting babies of parents who are patients Please call early in hospitalization for appointment (limited availability) [facility] Gay, MD; Quinlan, MD [address] [place], [place] [address] [phone] (press 1 to schedule appointment) Mon-Fri 8:00-5:00 Providers come to see babies at [facility] Does NOT accept Medicaid [facility] Mazer, MD [address] [place], [place] [address] [phone] Mon-Fri 8:30-5:00 (lunch 12:30-1:00), extended hours by appointment only Wed 5:00-6:30 Babies seen by [facility] providers Accepting Medicaid [facility] Banks, MD; Jordan, MD; Koberlein, MD [address], [place], [place] [address] [phone] Mon-Fri 8:00-5:00 Babies seen by [facility] providers Does NOT accept Medicaid [facility] Parker, MD; Hunter, MD; Wallace, DO [address] [place], [place] [address] [phone] Mon-Fri 8:00-5:00 Babies seen by [facility] providers Does NOT accept Medicaid [facility] Brandon, PA; Brecken, PA; Christy, NP; Dees, MD; DeClaire, MD; DeWeese, MD; Hansen, NP; Mills, NP; Parrish, NP; Smoot, NP; Summer, MD; Vapne,  MD [address] [place], [place] [address] [phone] Mon-Fri 8:30-5:00, Sat 10:00-1:00 Providers come to see babies at [facility] Does NOT accept Medicaid Free prenatal information session Tuesdays at 4:45pm [facility] Bouska, MD; Gordon, PA; Jeffery, PA; Weber, PA [address] [place] [address] [phone] Mon-Fri 7:30-5:30 Babies seen by [facility] providers [facility] [address], [place], [place]  [address] [phone]   Fax - [phone]  [place] ([address] & [address]) [facility] [place] [address] [place], [place] [address] [phone] Mon-Thur 8:00-6:00 Providers come to see babies at [facility] Accepting Medicaid [facility] Anderson, NP; Badger, MD; Beal, PA; Spencer, PA [address] [place], [place] [address] [phone] Mon-Thur 7:30-7:30, Fri 7:30-4:30 Babies seen by [facility] providers Accepting Medicaid [facility] Agbuya, MD; Klett, NP; Romgoolam, MD [address], [place], [place] [address] [phone] Mon-Fri 8:30-5:00, Sat 8:30-12:00 Providers come to see babies at [facility] Accepting Medicaid Must have "Meet & Greet" appointment at office prior to delivery [facility] ([facility] McCord, MD; Wallace, MD; Wood, MD [address], [place], [place] [address] [phone] Mon-Wed 8:00-6:00, Thur-Fri 8:00-5:00, Sat 9:00-12:00 Providers come to see babies at [facility] Does NOT accept Medicaid Only accepting siblings of current patients [facility]  [address], [place], [place]  [address] [phone]   Fax - [phone] [facility] [address], [place], [place]  [address] [phone]   Fax -  [phone]  [place] ([address] & [address]) [facility] Cirigliano, DO; Matthews, DO [address] [place], [place] [address] [phone] Mon-Fri 7:00-5:00 Babies seen by [facility] providers Does NOT accept Medicaid [facility] Briscoe, MD; Howley, PA; Moreira, PA [address], [place], [place] [address] [phone] Mon-Fri 8:00-5:00 Babies seen by [facility] providers Accepting Medicaid [facility] Boyd, MD; Church, PA; Jones, NP; Osborn, PA [address], , [place] [address] [phone] Mon-Fri 8:00-5:00 Babies seen by providers at [facility] Accepting Medicaid  [place] ([address]) [facility] [place] Wendling, DO [address] [place], [place] [address] [phone] Mon-Fri 8:00-5:00 Babies seen by [facility] providers Does NOT accept Medicaid Limited availability, please call early in hospitalization to schedule follow-up [facility] Calderon, PA; Cummings, MD; Dillard, MD; Martin, PA; Olson, MD; VanDeven, PA [address], [place], [place] [address] [phone] Mon-Fri 8:30-5:00, Sat 9:00-12:00 Babies seen by providers at [facility] Accepting Medicaid Please register online then schedule online or call office [email or web address] Hunter, NP; Kumar, MD; Martin Rogers, PA [address], [place], [place] [address] [phone] Mon-Fri 8:00-5:00 Babies seen by providers at [facility] Accepting Medicaid [facility] ([facility]) [place], MD; Kristi Fleenor, NP; West, MD [address], [place], [place] [address] [phone] Mon-Fri 8:00-5:30, Sat&Sun by appointment (phones open at  8:30) Babies seen by [facility] providers Accepting Medicaid Must be a first-time baby or sibling of current patient [facility] [place]  [address], [place], [place]  [address] [phone]   Fax - [phone]  [place] ([address]) [facility] Brown, PA; Cowen, PA; Rice, MD; Helton, PA; Spry, MD [address] [place], [place] [address] [phone] Mon-Thur 8:00-7:00, Fri 8:00-5:00, Sat 8:00-12:00, Sun 9:00-12:00 Babies seen by [facility] providers Accepting Medicaid [facility] Coe-Goins, MD; Marshall, MD; Pierre-Louis, MD [address] [place] [address], [place], [place] [address] [phone] Mon-Thur 8:00-5:00 Babies seen by providers at [facility] Accepting Medicaid [facility] Bratton, MD; Coe-Goins, MD; Hayes, MD; Lewis, MD; List, MD; Lott, MD; Marshall, MD; Moran, MD; O'Clancey [place], MD; Pierre-Louis, MD; Pitonzo, MD; Scholer, MD; Spangle, MD [address] [place], [place] [address] [phone] Mon-Fri 8:00-5:30, Sat (Oct.-Mar.) 9:00-1:00 Babies seen by providers at [facility] Accepting Medicaid Must fill out new patient packet, available online at [email or web address] [facility] ([facility]) Friddle, NP; Harris, NP; Kelly, NP; Logan, MD; Melvin, PA; Poth, MD; Ramadoss, MD; Stanton, NP [address], [place], [place] [address] [phone] Mon-Thur 8:00-5:30, Fri 8:00-5:00 Babies seen by providers at [facility] Accepting Medicaid  [place] ([address]) [facility] Dixon, PA; Daisetta, MD; Pickard, MD; Tapia, PA [address], [place], [place] [address] [phone] Mon-Fri 8:00-5:00 Babies seen by providers at [facility] Accepting Medicaid   [place] ([address]) [facility] [place] Masneri, DO; Meyers, MD; Nelson, PA [address], [place], [place] [address] [phone] Mon-Fri 8:00-5:00 Babies seen by providers at [facility] Does NOT accept Medicaid Limited appointment availability, please call early in hospitalization  [facility] [place] Kunedd, DO; McGowen, MD [address], [place], [place] [address] [phone] Mon-Fri 8:00-5:00 Babies seen by [facility] providers Does NOT accept Medicaid [facility] [place] Cameron, MD; MacDonald, MD; Michaels, PA; Nayak, MD [address] [place] [address], [place], [place] [address] [phone] Mon-Fri 8:00-5:00 After hours clinic ([address] [place], [place] [address]) [phone] Mon-Fri 5:00-8:00, Sat 12:00-6:00, Sun 10:00-4:00 Babies seen by [facility] providers Accepting Medicaid [facility] [place] [address], [place], [place]  [address] [phone]   Fax - [phone]  [place] ([address]) [facility] [place] [facility] Andy, MD [address], [place], [place] [address] [phone] Mon-Fri 8:00-5:00 Babies seen by [facility] providers Does NOT accept Medicaid [facility] [place] ([facility] [place]) Eksir, MD [address], [place], [place] [address] [phone] Mon-Thur 8:00-7:00, Fri 8:00-5:00, Sat 8:00-12:00 Babies seen by providers at [facility] Accepting Medicaid - but does not have vaccinations in office (must be received elsewhere) Limited availability, please call early in hospitalization  [place] ([address]) [facility]  Charlene Flemming, MD [address], [place] [address] [phone]  Fax [phone]  [facility]  Kimberly Newton, MD, Annamarie Streilein, PA, Carla [place], PA [address] [place], [place]  [address] [phone] [facility]  [address], [place], [place] [address] [phone] [address], [place], [place] [address] [phone] (West Office)  [facility] [address], [place], [place] [address] [phone] [facility] [address], [place], [place] [address] [phone] [facility] [address], [place], [place] [address] [phone] [facility] [address], [place], [place] [address] [phone] [facility] [address], [place], [place] [address] [phone] [facility] [address], [place], [place] [address] [phone] [facility]  [address], [place], [place] [address] [phone] Dr[facility] [address] [place] [address], [place], [place] [address] [phone] [facility] [address], [place],  [address] [phone] [facility] [address], [place],  [address] [phone]  

## 2021-01-13 NOTE — Progress Notes (Signed)
New OB Intake  I connected with  Hailey Watson on 01/13/21 at  9:15 AM EDT by MyChart Video Visit and verified that I am speaking with the correct person using two identifiers. Nurse is located at [employer] and pt is located at work.  I discussed the limitations, risks, security and privacy concerns of performing an evaluation and management service by telephone and the availability of in person appointments. I also discussed with the patient that there may be a patient responsible charge related to this service. The patient expressed understanding and agreed to proceed.  I explained I am completing New OB Intake today. We discussed her EDD of 07/08/21 that is based on LMP of 10/01/20. Pt is G5/P2. I reviewed her allergies, medications, Medical/Surgical/OB history, and appropriate screenings. I informed her of [facility] services. Based on history, this is a/an  pregnancy uncomplicated .   There are no problems to display for this patient.   Concerns addressed today  Delivery Plans:  Plans to deliver at [facility].   MyChart/Babyscripts MyChart access verified. I explained pt will have some visits in office and some virtually. Babyscripts instructions given and order placed. Patient verifies receipt of registration text/e-mail. Account successfully created and app downloaded.  Blood Pressure Cuff  Blood pressure cuff ordered for patient to pick-up from [employer]. Explained after first prenatal appt pt will check weekly and document in Babyscripts.  Weight scale: Patient    have weight scale. Weight scale ordered for patient to pick up form [facility].   Anatomy US Explained first scheduled Korea will be around 19 weeks. Anatomy US scheduled for 02/09/21 at 7:45a. Pt notified to arrive at 7:30a.  Labs Discussed Avelina Laine genetic screening with patient. Would like both Panorama and Horizon drawn at new OB visit. Routine prenatal labs needed.  Covid Vaccine Patient has covid vaccine.    Mother/ Baby Dyad Candidate?    If yes, offer as possibility  Informed patient of [facility] website  and placed link in her AVS.   Social Determinants of Health Food Insecurity: Patient expresses food insecurity. Food Market information given to patient; explained patient may visit at the end of first OB appointment. WIC Referral: Patient is interested in referral to [facility]  Transportation: Patient denies transportation needs. Childcare: Discussed no children allowed at ultrasound appointments. Offered childcare services; patient declines childcare services at this time.  Send link to Pregnancy Navigators   Placed OB Box on problem list and updated  First visit review I reviewed new OB appt with pt. I explained she will have a pelvic exam, ob bloodwork with genetic screening, and PAP smear. Explained pt will be seen by Luna Kitchens, CNM at first visit; encounter routed to appropriate provider. Explained that patient will be seen by pregnancy navigator following visit with provider. [facility] information placed in AVS.   Henrietta Dine, CMA 01/13/2021  9:32 AM

## 2021-02-07 ENCOUNTER — Ambulatory Visit (INDEPENDENT_AMBULATORY_CARE_PROVIDER_SITE_OTHER): Payer: Medicaid Other | Admitting: Student

## 2021-02-07 ENCOUNTER — Other Ambulatory Visit: Payer: Self-pay

## 2021-02-07 ENCOUNTER — Other Ambulatory Visit (HOSPITAL_COMMUNITY)
Admission: RE | Admit: 2021-02-07 | Discharge: 2021-02-07 | Disposition: A | Discharge status: Home / Self Care | Payer: Medicaid Other | Source: Ambulatory Visit | Attending: Student | Admitting: Student

## 2021-02-07 VITALS — BP 113/72 | HR 84 | Wt 169.9 lb

## 2021-02-07 DIAGNOSIS — Z3480 Encounter for supervision of other normal pregnancy, unspecified trimester: Secondary | ICD-10-CM

## 2021-02-07 DIAGNOSIS — Z3A18 18 weeks gestation of pregnancy: Secondary | ICD-10-CM

## 2021-02-07 DIAGNOSIS — Z98891 History of uterine scar from previous surgery: Secondary | ICD-10-CM

## 2021-02-07 MED ORDER — PRENATAL PLUS 27-1 MG PO TABS: 1.0000 | ORAL_TABLET | Freq: Every day | ORAL | 11 refills | Status: AC

## 2021-02-07 MED ORDER — ASPIRIN 81 MG PO CHEW: 81.0000 mg | CHEWABLE_TABLET | Freq: Every day | ORAL | 1 refills | Status: DC

## 2021-02-07 NOTE — Progress Notes (Signed)
Patient stated that she has pain from "cyst of right ovary"

## 2021-02-07 NOTE — Progress Notes (Signed)
  Subjective:    Hailey Watson is being seen today for her first obstetrical visit.  This is not a planned pregnancy. She is at [redacted]w[redacted]d gestation. Her obstetrical history is significant for  two prior c/sections . Relationship with FOB: spouse, living together. Patient does intend to breast feed. Pregnancy history fully reviewed.  Patient reports no complaints.  Review of Systems:   Review of Systems  Constitutional: Negative.   HENT: Negative.    Respiratory: Negative.    Cardiovascular: Negative.   Gastrointestinal: Negative.   Genitourinary: Negative.   Musculoskeletal: Negative.   Neurological: Negative.   Hematological: Negative.   Psychiatric/Behavioral: Negative.     Objective:     BP 113/72   Pulse 84   Wt 169 lb 14.4 oz (77.1 kg)   LMP 10/01/2020 (Approximate)   BMI 40.96 kg/m  Physical Exam Constitutional:      Appearance: Normal appearance.  Pulmonary:     Effort: Pulmonary effort is normal.  Abdominal:     General: Abdomen is flat.  Genitourinary:    General: Normal vulva.     Vagina: No vaginal discharge.     Rectum: Guaiac result negative.  Musculoskeletal:        General: Normal range of motion.  Skin:    General: Skin is warm.     Capillary Refill: Capillary refill takes less than 2 seconds.  Neurological:     General: No focal deficit present.     Mental Status: She is alert.  Psychiatric:        Mood and Affect: Mood normal.    Maternal Exam:  Introitus: Normal vulva. Vagina is negative for discharge.      Assessment:    Pregnancy: [id number] Patient Active Problem List   Diagnosis Date Noted   Supervision of other normal pregnancy, antepartum 01/13/2021       Plan:     Initial labs drawn. Prenatal vitamins. Problem list reviewed and updated. AFP3 discussed: ordered. Role of ultrasound in pregnancy discussed; fetal survey: ordered. Amniocentesis discussed: not indicated. Follow up in 4 weeks. 75% of 20 min visit spent on  counseling and coordination of care.  -patient doing well; welcomed to practice. They are happy to be in [place] after living in [place] and [place].  -patient will have labs drawn today, pap smear done today -patient wants repeat C/section and also wants to have her tubes tied; can sign at next visit -started on baby ASA -patient will pick up BP cuff Hailey Watson 02/07/2021

## 2021-02-09 ENCOUNTER — Ambulatory Visit: Payer: Medicaid Other

## 2021-02-09 LAB — CYTOLOGY - PAP
Chlamydia: NEGATIVE
Comment: NEGATIVE
Comment: NORMAL
Diagnosis: NEGATIVE
Neisseria Gonorrhea: NEGATIVE

## 2021-02-09 LAB — CULTURE, OB URINE

## 2021-02-09 LAB — URINE CULTURE, OB REFLEX

## 2021-02-10 ENCOUNTER — Other Ambulatory Visit: Payer: Self-pay | Admitting: *Deleted

## 2021-02-10 ENCOUNTER — Ambulatory Visit: Payer: Medicaid Other | Attending: Student

## 2021-02-10 ENCOUNTER — Other Ambulatory Visit: Payer: Self-pay | Admitting: Student

## 2021-02-10 ENCOUNTER — Other Ambulatory Visit: Payer: Self-pay

## 2021-02-10 DIAGNOSIS — Z363 Encounter for antenatal screening for malformations: Secondary | ICD-10-CM | POA: Diagnosis not present

## 2021-02-10 DIAGNOSIS — E669 Obesity, unspecified: Secondary | ICD-10-CM

## 2021-02-10 DIAGNOSIS — Z3480 Encounter for supervision of other normal pregnancy, unspecified trimester: Secondary | ICD-10-CM

## 2021-02-10 DIAGNOSIS — O09212 Supervision of pregnancy with history of pre-term labor, second trimester: Secondary | ICD-10-CM

## 2021-02-10 DIAGNOSIS — Z3A18 18 weeks gestation of pregnancy: Secondary | ICD-10-CM | POA: Insufficient documentation

## 2021-02-10 DIAGNOSIS — O99212 Obesity complicating pregnancy, second trimester: Secondary | ICD-10-CM | POA: Diagnosis not present

## 2021-02-10 DIAGNOSIS — O34219 Maternal care for unspecified type scar from previous cesarean delivery: Secondary | ICD-10-CM

## 2021-02-10 DIAGNOSIS — Z6841 Body Mass Index (BMI) 40.0 and over, adult: Secondary | ICD-10-CM

## 2021-02-10 LAB — CBC/D/PLT+RPR+RH+ABO+RUBIGG...
Antibody Screen: NEGATIVE
Basophils Absolute: 0 10*3/uL (ref 0.0–0.2)
Basos: 0 %
EOS (ABSOLUTE): 0.1 10*3/uL (ref 0.0–0.4)
Eos: 1 %
HCV Ab: 0.2 s/co ratio (ref 0.0–0.9)
HIV Screen 4th Generation wRfx: NONREACTIVE
Hematocrit: 38.3 % (ref 34.0–46.6)
Hemoglobin: 13 g/dL (ref 11.1–15.9)
Hepatitis B Surface Ag: NEGATIVE
Immature Grans (Abs): 0.1 10*3/uL (ref 0.0–0.1)
Immature Granulocytes: 1 %
Lymphocytes Absolute: 2.4 10*3/uL (ref 0.7–3.1)
Lymphs: 20 %
MCH: 31.7 pg (ref 26.6–33.0)
MCHC: 33.9 g/dL (ref 31.5–35.7)
MCV: 93 fL (ref 79–97)
Monocytes Absolute: 0.7 10*3/uL (ref 0.1–0.9)
Monocytes: 5 %
Neutrophils Absolute: 8.9 10*3/uL — ABNORMAL HIGH (ref 1.4–7.0)
Neutrophils: 73 %
Platelets: 274 10*3/uL (ref 150–450)
RBC: 4.1 x10E6/uL (ref 3.77–5.28)
RDW: 13.5 % (ref 11.7–15.4)
RPR Ser Ql: NONREACTIVE
Rh Factor: POSITIVE
Rubella Antibodies, IGG: 2.52 index (ref >0.99)
WBC: 12.2 10*3/uL — ABNORMAL HIGH (ref 3.4–10.8)

## 2021-02-10 LAB — AFP, SERUM, OPEN SPINA BIFIDA
AFP Value: 61.1 ng/mL
Gest. Age on Collection Date: 18.3 weeks
Maternal Age At EDD: 32.8 yr

## 2021-02-10 LAB — HCV INTERPRETATION

## 2021-02-10 LAB — HEMOGLOBIN A1C
Est. average glucose Bld gHb Est-mCnc: 105 mg/dL
Hgb A1c MFr Bld: 5.3 % (ref 4.8–5.6)

## 2021-02-28 ENCOUNTER — Ambulatory Visit: Payer: Medicaid Other

## 2021-03-02 ENCOUNTER — Other Ambulatory Visit: Payer: Self-pay

## 2021-03-02 ENCOUNTER — Other Ambulatory Visit: Payer: Self-pay | Admitting: *Deleted

## 2021-03-02 ENCOUNTER — Ambulatory Visit (INDEPENDENT_AMBULATORY_CARE_PROVIDER_SITE_OTHER): Payer: Medicaid Other | Admitting: Medical

## 2021-03-02 DIAGNOSIS — Z3480 Encounter for supervision of other normal pregnancy, unspecified trimester: Secondary | ICD-10-CM

## 2021-03-02 NOTE — Progress Notes (Signed)
opened in error

## 2021-03-04 LAB — AFP, SERUM, OPEN SPINA BIFIDA
AFP MoM: 0.96
AFP Value: 69.7 ng/mL
Gest. Age on Collection Date: 21.5 weeks
Maternal Age At EDD: 32.8 yr
OSBR Risk 1 IN: 10000
Test Results:: NEGATIVE
Weight: 171 [lb_av]

## 2021-03-07 ENCOUNTER — Encounter: Payer: Self-pay | Admitting: Family Medicine

## 2021-03-07 ENCOUNTER — Other Ambulatory Visit: Payer: Self-pay

## 2021-03-07 ENCOUNTER — Ambulatory Visit (INDEPENDENT_AMBULATORY_CARE_PROVIDER_SITE_OTHER): Payer: Medicaid Other | Admitting: Family Medicine

## 2021-03-07 VITALS — BP 111/57 | HR 84 | Wt 176.0 lb

## 2021-03-07 DIAGNOSIS — Z3A22 22 weeks gestation of pregnancy: Secondary | ICD-10-CM

## 2021-03-07 DIAGNOSIS — Z1331 Encounter for screening for depression: Secondary | ICD-10-CM

## 2021-03-07 DIAGNOSIS — Z3480 Encounter for supervision of other normal pregnancy, unspecified trimester: Secondary | ICD-10-CM

## 2021-03-07 DIAGNOSIS — Z98891 History of uterine scar from previous surgery: Secondary | ICD-10-CM

## 2021-03-07 NOTE — Addendum Note (Signed)
Addended by: Cline Crock on: 03/07/2021 05:00 PM   Modules accepted: Orders

## 2021-03-07 NOTE — Progress Notes (Signed)
   Subjective:  Hailey Watson is a 30 y.o. [id number] at [redacted]w[redacted]d being seen today for ongoing prenatal care.  She is currently monitored for the following issues for this low-risk pregnancy and has Supervision of other normal pregnancy, antepartum and History of C-section on their problem list.  Patient reports no complaints.  Contractions: Not present. Vag. Bleeding: None.  Movement: Present. Denies leaking of fluid.   The following portions of the patient's history were reviewed and updated as appropriate: allergies, current medications, past family history, past medical history, past social history, past surgical history and problem list. Problem list updated.  Objective:   Vitals:   03/07/21 1057  BP: (!) 111/57  Pulse: 84  Weight: 176 lb (79.8 kg)    Fetal Status: Fetal Heart Rate (bpm): 155   Movement: Present     General:  Alert, oriented and cooperative. Patient is in no acute distress.  Skin: Skin is warm and dry. No rash noted.   Cardiovascular: Normal heart rate noted  Respiratory: Normal respiratory effort, no problems with respiration noted  Abdomen: Soft, gravid, appropriate for gestational age. Pain/Pressure: Present     Pelvic: Vag. Bleeding: None     Cervical exam deferred        Extremities: Normal range of motion.  Edema: Trace  Mental Status: Normal mood and affect. Normal behavior. Normal judgment and thought content.    Assessment and Plan:  Pregnancy: [id number] at [redacted]w[redacted]d  1. Supervision of other normal pregnancy, antepartum 2. [redacted] weeks gestation of pregnancy Normal heart tones. No obstetric complaints today. Up to date on labs. Has follow up growth Korea later this week. - follow up in 4 weeks - reports not taking ASA as doesn't help with pain. Discussed in depth regarding ASA is for prevention of hypertension spectrum and therefore would recommend taking. Patient expresses understanding and will start taking.  3. History of C-section 2 prior cesarean sections.  Will need repeat CS.   4. Back pain and lower groin discomfort Reports feels discomfort with her back and lower groin. Has not tried belly band. 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 Worse on left.  - discussed and recommended wrist brace  6. Contraception counseling Plans BTL during time of CS  Preterm labor symptoms and general obstetric precautions including but not limited to vaginal bleeding, contractions, leaking of fluid and fetal movement were reviewed in detail with the patient. Please refer to After Visit Summary for other counseling recommendations.  Return in about 4 weeks (around 04/04/2021) for LROB and 28 week labs.   Warner Mccreedy, MD, MPH OB Fellow, Faculty Practice

## 2021-03-07 NOTE — Patient Instructions (Addendum)
You can get a wrist brace for your wrist pain. You can go to [facility] or [facility] or [facility] or online and get something similar to this brace on this link [email or web address]

## 2021-03-10 ENCOUNTER — Ambulatory Visit: Payer: Medicaid Other

## 2021-03-10 ENCOUNTER — Other Ambulatory Visit: Payer: Self-pay | Admitting: Obstetrics and Gynecology

## 2021-03-10 DIAGNOSIS — O34219 Maternal care for unspecified type scar from previous cesarean delivery: Secondary | ICD-10-CM

## 2021-03-10 DIAGNOSIS — Z6841 Body Mass Index (BMI) 40.0 and over, adult: Secondary | ICD-10-CM

## 2021-03-15 NOTE — BH Specialist Note (Signed)
Pt did not arrive to video visit and did not answer the phone; Left HIPPA-compliant message to call back Cynia Abruzzo from [facility] at  [phone] (Jerauld Bostwick's office).  ?; left MyChart message for patient.  ? ?

## 2021-03-18 ENCOUNTER — Ambulatory Visit: Payer: Medicaid Other | Attending: Student

## 2021-03-18 ENCOUNTER — Ambulatory Visit: Payer: Medicaid Other

## 2021-03-29 ENCOUNTER — Ambulatory Visit: Payer: Medicaid Other | Admitting: Clinical

## 2021-03-29 ENCOUNTER — Other Ambulatory Visit: Payer: Self-pay

## 2021-03-29 DIAGNOSIS — Z91199 Patient's noncompliance with other medical treatment and regimen due to unspecified reason: Secondary | ICD-10-CM

## 2021-03-29 DIAGNOSIS — Z3480 Encounter for supervision of other normal pregnancy, unspecified trimester: Secondary | ICD-10-CM

## 2021-04-02 NOTE — Progress Notes (Signed)
Chart reviewed for nurse visit. Agree with plan of care.  ° °Kess Mcilwain Lorraine, CNM °04/02/2021 3:24 AM  ° °

## 2021-04-04 NOTE — Progress Notes (Signed)
ous Subjective:  Hailey Watson is a 31 y.o. [id number] at [redacted]w[redacted]d being seen today for ongoing prenatal care.  She is currently monitored for the following issues for this low-risk pregnancy and has Supervision of other normal pregnancy, antepartum and History of C-section on their problem list.  Patient reports  right calf pain .  Contractions: Not present. Vag. Bleeding: None.  Movement: Present. Denies leaking of fluid.   The following portions of the patient's history were reviewed and updated as appropriate: allergies, current medications, past family history, past medical history, past social history, past surgical history and problem list. Problem list updated.  Objective:   Vitals:   04/05/21 0847  BP: 116/65  Pulse: 92  Weight: 182 lb 8 oz (82.8 kg)    Fetal Status: Fetal Heart Rate (bpm): 142   Movement: Present     General:  Alert, oriented and cooperative. Patient is in no acute distress.  Skin: Skin is warm and dry. No rash noted.   Cardiovascular: Normal heart rate noted  Respiratory: Normal respiratory effort, no problems with respiration noted  Abdomen: Soft, gravid, appropriate for gestational age. Pain/Pressure: Present     Pelvic: Vag. Bleeding: None     Cervical exam deferred        Extremities: Normal range of motion.  Edema: Mild pitting, slight indentation. Right calf mild  tenderness to palpation  Mental Status: Normal mood and affect. Normal behavior. Normal judgment and thought content.    Assessment and Plan:  Pregnancy: [id number] at [redacted]w[redacted]d  1. Supervision of other normal pregnancy, antepartum 3. [redacted] weeks gestation of pregnancy Doing well. Fundal height 27cm and fetal heart tones normal. Does have calf pain today. See plan below - 28 week labs today  - Tdap done today  2. History of C-section Plans repeat CS  3. Right calf pain Since two weeks ago. Constant. 7-8/10. Has not tried anything for pain. No shortness of breath. No tachycardia. O2 sat normal.  Therefore at this time cannot rule out DVT and will send for RLQ [place]. Discussed with patient and opted for outpatient [place] as opposed to going to MAU. Given overall well appearing and normal vitals ok to do outpatient management - sent for for RLQ [place] at [facility] long - extensive return/MAU precautions provided.  4. Contraception Plans BTL. Papers signed today.   Preterm labor symptoms and general obstetric precautions including but not limited to vaginal bleeding, contractions, leaking of fluid and fetal movement were reviewed in detail with the patient. Please refer to After Visit Summary for other counseling recommendations.  Return in about 4 weeks (around 05/03/2021) for Any MD/DO.  Warner Mccreedy, MD, MPH OB Fellow, Faculty Practice

## 2021-04-05 ENCOUNTER — Ambulatory Visit (INDEPENDENT_AMBULATORY_CARE_PROVIDER_SITE_OTHER): Payer: Medicaid Other | Admitting: Family Medicine

## 2021-04-05 ENCOUNTER — Ambulatory Visit (HOSPITAL_COMMUNITY): Admission: RE | Admit: 2021-04-05 | Payer: Medicaid Other | Source: Ambulatory Visit

## 2021-04-05 ENCOUNTER — Other Ambulatory Visit: Payer: Self-pay

## 2021-04-05 ENCOUNTER — Other Ambulatory Visit: Payer: Medicaid Other

## 2021-04-05 VITALS — BP 116/65 | HR 92 | Wt 182.5 lb

## 2021-04-05 DIAGNOSIS — Z3480 Encounter for supervision of other normal pregnancy, unspecified trimester: Secondary | ICD-10-CM

## 2021-04-05 DIAGNOSIS — Z23 Encounter for immunization: Secondary | ICD-10-CM

## 2021-04-05 DIAGNOSIS — Z98891 History of uterine scar from previous surgery: Secondary | ICD-10-CM

## 2021-04-05 DIAGNOSIS — M79661 Pain in right lower leg: Secondary | ICD-10-CM

## 2021-04-05 DIAGNOSIS — Z3A26 26 weeks gestation of pregnancy: Secondary | ICD-10-CM

## 2021-04-05 NOTE — Progress Notes (Signed)
Vitals rechecked at end of visit: HR 89, O2 sat 100%. Venous duplex ordered and scheduled for today at 2 PM at [facility].  Apolonio Schneiders RN 04/05/21

## 2021-04-06 ENCOUNTER — Other Ambulatory Visit: Payer: Self-pay | Admitting: Family Medicine

## 2021-04-06 ENCOUNTER — Encounter: Payer: Self-pay | Admitting: *Deleted

## 2021-04-06 DIAGNOSIS — Z3480 Encounter for supervision of other normal pregnancy, unspecified trimester: Secondary | ICD-10-CM

## 2021-04-06 LAB — CBC
Hematocrit: 32.8 % — ABNORMAL LOW (ref 34.0–46.6)
Hemoglobin: 10.9 g/dL — ABNORMAL LOW (ref 11.1–15.9)
MCH: 31.3 pg (ref 26.6–33.0)
MCHC: 33.2 g/dL (ref 31.5–35.7)
MCV: 94 fL (ref 79–97)
Platelets: 290 10*3/uL (ref 150–450)
RBC: 3.48 x10E6/uL — ABNORMAL LOW (ref 3.77–5.28)
RDW: 12.7 % (ref 11.7–15.4)
WBC: 9.8 10*3/uL (ref 3.4–10.8)

## 2021-04-06 LAB — RPR: RPR Ser Ql: NONREACTIVE

## 2021-04-06 LAB — GLUCOSE TOLERANCE, 2 HOURS W/ 1HR
Glucose, 1 hour: 113 mg/dL (ref 70–179)
Glucose, 2 hour: 84 mg/dL (ref 70–152)
Glucose, Fasting: 73 mg/dL (ref 70–91)

## 2021-04-06 LAB — HIV ANTIBODY (ROUTINE TESTING W REFLEX): HIV Screen 4th Generation wRfx: NONREACTIVE

## 2021-04-06 LAB — ANTIBODY SCREEN: Antibody Screen: NEGATIVE

## 2021-04-06 MED ORDER — FERROUS SULFATE 325 (65 FE) MG PO TABS: 325.0000 mg | ORAL_TABLET | ORAL | 2 refills | Status: DC

## 2021-04-11 ENCOUNTER — Ambulatory Visit (HOSPITAL_BASED_OUTPATIENT_CLINIC_OR_DEPARTMENT_OTHER): Payer: Medicaid Other

## 2021-04-11 ENCOUNTER — Other Ambulatory Visit: Payer: Self-pay | Admitting: *Deleted

## 2021-04-11 ENCOUNTER — Ambulatory Visit: Payer: Medicaid Other | Attending: Student | Admitting: *Deleted

## 2021-04-11 ENCOUNTER — Telehealth: Payer: Self-pay

## 2021-04-11 ENCOUNTER — Other Ambulatory Visit: Payer: Self-pay

## 2021-04-11 VITALS — BP 103/55 | HR 90

## 2021-04-11 DIAGNOSIS — E669 Obesity, unspecified: Secondary | ICD-10-CM | POA: Diagnosis not present

## 2021-04-11 DIAGNOSIS — O34219 Maternal care for unspecified type scar from previous cesarean delivery: Secondary | ICD-10-CM

## 2021-04-11 DIAGNOSIS — O99212 Obesity complicating pregnancy, second trimester: Secondary | ICD-10-CM | POA: Diagnosis present

## 2021-04-11 DIAGNOSIS — N858 Other specified noninflammatory disorders of uterus: Secondary | ICD-10-CM | POA: Diagnosis not present

## 2021-04-11 DIAGNOSIS — Z6841 Body Mass Index (BMI) 40.0 and over, adult: Secondary | ICD-10-CM

## 2021-04-11 DIAGNOSIS — Z3A27 27 weeks gestation of pregnancy: Secondary | ICD-10-CM | POA: Diagnosis not present

## 2021-04-11 DIAGNOSIS — O09212 Supervision of pregnancy with history of pre-term labor, second trimester: Secondary | ICD-10-CM | POA: Diagnosis not present

## 2021-04-11 DIAGNOSIS — Z3480 Encounter for supervision of other normal pregnancy, unspecified trimester: Secondary | ICD-10-CM

## 2021-04-11 NOTE — Telephone Encounter (Signed)
Patient called in explaining she was unable to get in touch with [facility] [employer].   Patient has Medicaid - I explained that she should be contacting Medicaid Transportation for her rides.   [facility]

## 2021-04-12 ENCOUNTER — Ambulatory Visit (HOSPITAL_COMMUNITY): Admission: RE | Admit: 2021-04-12 | Payer: Medicaid Other | Source: Ambulatory Visit

## 2021-04-19 ENCOUNTER — Telehealth: Payer: Self-pay

## 2021-04-19 NOTE — Telephone Encounter (Signed)
[facility] [employer] was scheduled for patient on 04/11/21 - patient aware she needs to contact Medicaid transportation (has [employer] Information sheet) for future appointments.

## 2021-04-27 NOTE — Progress Notes (Signed)
Patient presented for labs only. 

## 2021-05-05 ENCOUNTER — Encounter: Payer: Medicaid Other | Admitting: Obstetrics & Gynecology

## 2021-05-23 ENCOUNTER — Ambulatory Visit: Payer: Medicaid Other | Attending: Obstetrics

## 2021-05-23 ENCOUNTER — Ambulatory Visit: Payer: Medicaid Other

## 2021-05-30 ENCOUNTER — Ambulatory Visit: Payer: Medicaid Other | Admitting: *Deleted

## 2021-05-30 ENCOUNTER — Encounter: Payer: Self-pay | Admitting: *Deleted

## 2021-05-30 ENCOUNTER — Ambulatory Visit: Payer: Medicaid Other | Attending: Obstetrics

## 2021-05-30 ENCOUNTER — Other Ambulatory Visit: Payer: Self-pay

## 2021-05-30 ENCOUNTER — Other Ambulatory Visit: Payer: Self-pay | Admitting: *Deleted

## 2021-05-30 ENCOUNTER — Telehealth: Payer: Self-pay

## 2021-05-30 VITALS — BP 114/69 | HR 102

## 2021-05-30 DIAGNOSIS — O34219 Maternal care for unspecified type scar from previous cesarean delivery: Secondary | ICD-10-CM | POA: Diagnosis not present

## 2021-05-30 DIAGNOSIS — R638 Other symptoms and signs concerning food and fluid intake: Secondary | ICD-10-CM

## 2021-05-30 DIAGNOSIS — E669 Obesity, unspecified: Secondary | ICD-10-CM | POA: Diagnosis not present

## 2021-05-30 DIAGNOSIS — Z6833 Body mass index (BMI) 33.0-33.9, adult: Secondary | ICD-10-CM | POA: Diagnosis present

## 2021-05-30 DIAGNOSIS — Z3A34 34 weeks gestation of pregnancy: Secondary | ICD-10-CM

## 2021-05-30 DIAGNOSIS — O99212 Obesity complicating pregnancy, second trimester: Secondary | ICD-10-CM | POA: Insufficient documentation

## 2021-06-02 ENCOUNTER — Encounter: Payer: Medicaid Other | Admitting: Obstetrics and Gynecology

## 2021-06-07 ENCOUNTER — Ambulatory Visit: Payer: Medicaid Other

## 2021-06-07 ENCOUNTER — Other Ambulatory Visit: Payer: Self-pay

## 2021-06-13 ENCOUNTER — Ambulatory Visit: Payer: Medicaid Other | Attending: Obstetrics and Gynecology

## 2021-06-13 ENCOUNTER — Ambulatory Visit: Payer: Medicaid Other | Admitting: *Deleted

## 2021-06-13 ENCOUNTER — Telehealth: Payer: Self-pay

## 2021-06-13 ENCOUNTER — Encounter: Payer: Self-pay | Admitting: *Deleted

## 2021-06-13 ENCOUNTER — Other Ambulatory Visit: Payer: Self-pay

## 2021-06-13 VITALS — BP 115/68 | HR 86

## 2021-06-13 DIAGNOSIS — Z3480 Encounter for supervision of other normal pregnancy, unspecified trimester: Secondary | ICD-10-CM

## 2021-06-13 DIAGNOSIS — E669 Obesity, unspecified: Secondary | ICD-10-CM

## 2021-06-13 DIAGNOSIS — O99213 Obesity complicating pregnancy, third trimester: Secondary | ICD-10-CM | POA: Insufficient documentation

## 2021-06-13 DIAGNOSIS — O34219 Maternal care for unspecified type scar from previous cesarean delivery: Secondary | ICD-10-CM

## 2021-06-13 DIAGNOSIS — Z3A36 36 weeks gestation of pregnancy: Secondary | ICD-10-CM | POA: Diagnosis not present

## 2021-06-13 DIAGNOSIS — O99212 Obesity complicating pregnancy, second trimester: Secondary | ICD-10-CM

## 2021-06-13 DIAGNOSIS — R638 Other symptoms and signs concerning food and fluid intake: Secondary | ICD-10-CM | POA: Insufficient documentation

## 2021-06-13 NOTE — Telephone Encounter (Signed)
Patient called to scheduled a ride. Patient states she called medicaid to for transportation and never gets pick up. ? ?I informed patient transportation will no longer be available after this week. ? ?Scheduled Ride with [employer]  ?RIDE [phone] ?

## 2021-06-18 ENCOUNTER — Encounter: Payer: Self-pay | Admitting: Radiology

## 2021-06-20 ENCOUNTER — Ambulatory Visit: Payer: Medicaid Other

## 2021-06-20 ENCOUNTER — Ambulatory Visit: Payer: Medicaid Other | Attending: Obstetrics and Gynecology

## 2021-06-24 ENCOUNTER — Telehealth: Payer: Self-pay | Admitting: *Deleted

## 2021-06-24 NOTE — Telephone Encounter (Signed)
Patient called and left a voicemail this am that she is calling to speak to Dr. Kennon Rounds. States FMLA forms completed for pick up 06/20/21 Monday but she has been sick and unable to pick up. Wants to know if they can be faxed and asks for a call back so she can give a fax number. ?Staci Acosta ?

## 2021-06-27 ENCOUNTER — Ambulatory Visit: Payer: Medicaid Other | Attending: Obstetrics and Gynecology

## 2021-06-27 ENCOUNTER — Encounter (HOSPITAL_COMMUNITY): Payer: Self-pay | Admitting: Family Medicine

## 2021-06-27 ENCOUNTER — Inpatient Hospital Stay (HOSPITAL_COMMUNITY)
Admission: AD | Admit: 2021-06-27 | Discharge: 2021-06-27 | Disposition: A | Discharge status: Home / Self Care | Payer: Medicaid Other | Attending: Family Medicine | Admitting: Family Medicine

## 2021-06-27 ENCOUNTER — Encounter: Payer: Self-pay | Admitting: *Deleted

## 2021-06-27 ENCOUNTER — Ambulatory Visit: Payer: Medicaid Other | Admitting: *Deleted

## 2021-06-27 ENCOUNTER — Telehealth: Payer: Self-pay | Admitting: Family Medicine

## 2021-06-27 VITALS — BP 113/65 | HR 84

## 2021-06-27 DIAGNOSIS — R1030 Lower abdominal pain, unspecified: Secondary | ICD-10-CM | POA: Insufficient documentation

## 2021-06-27 DIAGNOSIS — Z3480 Encounter for supervision of other normal pregnancy, unspecified trimester: Secondary | ICD-10-CM

## 2021-06-27 DIAGNOSIS — Z3A38 38 weeks gestation of pregnancy: Secondary | ICD-10-CM | POA: Insufficient documentation

## 2021-06-27 DIAGNOSIS — Z7982 Long term (current) use of aspirin: Secondary | ICD-10-CM | POA: Insufficient documentation

## 2021-06-27 DIAGNOSIS — R638 Other symptoms and signs concerning food and fluid intake: Secondary | ICD-10-CM | POA: Insufficient documentation

## 2021-06-27 DIAGNOSIS — O98813 Other maternal infectious and parasitic diseases complicating pregnancy, third trimester: Secondary | ICD-10-CM | POA: Diagnosis not present

## 2021-06-27 DIAGNOSIS — O34219 Maternal care for unspecified type scar from previous cesarean delivery: Secondary | ICD-10-CM | POA: Insufficient documentation

## 2021-06-27 DIAGNOSIS — O99213 Obesity complicating pregnancy, third trimester: Secondary | ICD-10-CM | POA: Insufficient documentation

## 2021-06-27 DIAGNOSIS — E669 Obesity, unspecified: Secondary | ICD-10-CM

## 2021-06-27 DIAGNOSIS — B3731 Acute candidiasis of vulva and vagina: Secondary | ICD-10-CM | POA: Insufficient documentation

## 2021-06-27 LAB — URINALYSIS, ROUTINE W REFLEX MICROSCOPIC
Bilirubin Urine: NEGATIVE
Glucose, UA: NEGATIVE mg/dL
Hgb urine dipstick: NEGATIVE
Ketones, ur: NEGATIVE mg/dL
Nitrite: NEGATIVE
Protein, ur: NEGATIVE mg/dL
Specific Gravity, Urine: 1.009 (ref 1.005–1.030)
pH: 6 (ref 5.0–8.0)

## 2021-06-27 LAB — WET PREP, GENITAL
Sperm: NONE SEEN
Trich, Wet Prep: NONE SEEN
WBC, Wet Prep HPF POC: >=10 — AB (ref <10)

## 2021-06-27 MED ORDER — ONDANSETRON 4 MG PO TBDP: 4.0000 mg | ORAL_TABLET | Freq: Three times a day (TID) | ORAL | 0 refills | Status: DC | PRN

## 2021-06-27 MED ORDER — TERCONAZOLE 0.4 % VA CREA: 1.0000 | TOPICAL_CREAM | Freq: Every day | VAGINAL | 0 refills | Status: AC

## 2021-06-27 MED ORDER — FERROUS SULFATE 325 (65 FE) MG PO TABS: 325.0000 mg | ORAL_TABLET | ORAL | 2 refills | Status: AC

## 2021-06-27 NOTE — Progress Notes (Signed)
States she has been bleeding almost every day since last Thurs. No recent intercourse. States she bleeds for an hour or two, then it will stop. When she feels pressure, she will bleed. Using 1-2 pads per episode. Not bleeding right now. ?

## 2021-06-27 NOTE — MAU Note (Signed)
Patient signed printed AVS. Placed in chart. 

## 2021-06-27 NOTE — MAU Provider Note (Signed)
?History  ?  ? ?CSN: [id number] ? ?Arrival date and time: 06/27/21 1155 ? ? None  ?  ? ?No chief complaint on file. ? ?HPI ?Kamaya Carleo is a 31 y.o. [id number] at [redacted]w[redacted]d who presents to MAU for lower abdominal pain/pressure and vaginal spotting. Patient reports ongoing sharp lower abdominal pain and pressure for several months that has worsened over the past few days. Pain is sharp and worse with movement, walking, and turning. She also reports intermittent vaginal bleeding since Thursday. Mostly occurs with wiping, but sometimes has to wear a pad at night time. Never saturates pad. She denies itching, odor, or urinary s/s. No leaking fluid. Endorses active fetal movement. ? ?Patient received some prenatal care at [facility], however has not been seen there since January. She has been going to her MFM appointments, which she reports she thought those were her prenatal appointments. Patient has had 2 previous c-sections and desires repeat with BTL.  ? ?OB History   ? ? Gravida  ?5  ? Para  ?2  ? Term  ?2  ? Preterm  ?   ? AB  ?2  ? Living  ?2  ?  ? ? SAB  ?2  ? IAB  ?   ? Ectopic  ?   ? Multiple  ?   ? Live Births  ?2  ?   ?  ? Obstetric Comments  ?Never dilated, "shallow" cervix  ?  ? ?  ? ? ?Past Medical History:  ?Diagnosis Date  ? Medical history non-contributory   ? ? ?Past Surgical History:  ?Procedure Laterality Date  ? CESAREAN SECTION    ? CHOLECYSTECTOMY  04/2019  ? ? ?Family History  ?Problem Relation Age of Onset  ? Healthy Mother   ? Cancer Father   ? ? ?Social History  ? ?Tobacco Use  ? Smoking status: Never  ? Smokeless tobacco: Never  ?Vaping Use  ? Vaping Use: Never used  ?Substance Use Topics  ? Alcohol use: Never  ? Drug use: Not Currently  ?  Types: Marijuana  ?  Comment: last use Mar 26 2022  ? ? ?Allergies: No Known Allergies ? ?Medications Prior to Admission  ?Medication Sig Dispense Refill Last Dose  ? ferrous sulfate 325 (65 FE) MG tablet Take 1 tablet (325 mg total) by mouth every other day.  (Patient not taking: Reported on 04/11/2021) 45 tablet 2   ? prenatal vitamin w/FE, FA (PRENATAL 1 + 1) 27-1 MG TABS tablet Take 1 tablet by mouth daily at 12 noon. 30 tablet 11 06/26/2021  ? aspirin 81 MG chewable tablet Chew 1 tablet (81 mg total) by mouth daily. 90 tablet 1   ? Blood Pressure Monitoring DEVI 1 each by Does not apply route once a week. 1 each 0   ? ? ?Review of Systems  ?Constitutional: Negative.   ?Respiratory: Negative.    ?Cardiovascular: Negative.   ?Gastrointestinal:  Positive for abdominal pain.  ?Genitourinary:  Positive for vaginal bleeding. Negative for dysuria and frequency.  ?Musculoskeletal: Negative.   ?Neurological: Negative.   ?Physical Exam  ? ?Blood pressure 115/72, pulse 67, temperature 99 ?F (37.2 ?C), temperature source Oral, resp. rate 20, height 4\' 6"  (1.372 m), weight 84.4 kg, last menstrual period 10/01/2020, SpO2 100 %. ? ?Physical Exam ?Vitals and nursing note reviewed. Exam conducted with a chaperone present.  ?Constitutional:   ?   General: She is not in acute distress. ?Eyes:  ?   Extraocular Movements: Extraocular movements  intact.  ?   Pupils: Pupils are equal, round, and reactive to light.  ?Cardiovascular:  ?   Rate and Rhythm: Normal rate.  ?Pulmonary:  ?   Effort: Pulmonary effort is normal.  ?Abdominal:  ?   Palpations: Abdomen is soft.  ?   Tenderness: There is no abdominal tenderness.  ?   Comments: Gravid  ?Genitourinary: ?   Comments: Blind swabs collected, small amount of white, clumpy discharge noted at introitus; no blood noted on swabs. ? ?VE: closed per RN; no blood noted on glove per RN ?Musculoskeletal:     ?   General: Normal range of motion.  ?   Cervical back: Normal range of motion.  ?Skin: ?   General: Skin is warm and dry.  ?Neurological:  ?   General: No focal deficit present.  ?   Mental Status: She is alert and oriented to person, place, and time.  ?Psychiatric:     ?   Mood and Affect: Mood normal.     ?   Behavior: Behavior normal.     ?    Thought Content: Thought content normal.     ?   Judgment: Judgment normal.  ? ?NST ?FHR: 125 bpm, moderate variability, +15x15 accels, no decels ?Toco: occasional ? ?MAU Course  ?Procedures ?NST ? ?MDM ?UA, culture pending. Wet prep positive for yeast, GC/CT pending. GBS culture collected. No bleeding noted on exam. Spotting likely related to yeast infection. Will send rx for terazol cream. NST reactive and reassuring. Message sent to [facility] to schedule prenatal appointment asap so that she can be scheduled for c-section.  ? ?Assessment and Plan  ?[redacted] weeks gestation of pregnancy ?Yeast vaginitis ?Previous c-section ? ?- Discharge home in stable condition ?- Rx for terazol sent ?- Strict return precautions reviewed. Return to MAU sooner for worsening symptoms ?- Follow up at [facility]. Someone will contact you to schedule appointment ? ? ? ?Renee Harder, CNM ?06/27/2021, 2:15 PM  ?

## 2021-06-27 NOTE — Telephone Encounter (Signed)
Patient come into office to schedule C-Section she is 38 weeks, she would like to know her next steps. ?

## 2021-06-27 NOTE — MAU Note (Signed)
Hailey Watson is a 31 y.o. at [redacted]w[redacted]d here in MAU reporting: since [name] has been having bleeding off and on.  When she is throwing up, has pain in her low abd, or if she is up [name].  Having pressure and pain.  At night, has to sleep with her legs apart because of the pelvic. MFM instructed her to get scheduled for c/s. ?Onset of complaint: last Thur ?Pain score: 8 ?Vitals:  ? 06/27/21 1234  ?BP: 119/63  ?Pulse: 83  ?Resp: 20  ?Temp: 99 ?F (37.2 ?C)  ?SpO2: 100%  ?   ?MOQ:947 ?Lab orders placed from triage:  none ?

## 2021-06-28 ENCOUNTER — Ambulatory Visit (INDEPENDENT_AMBULATORY_CARE_PROVIDER_SITE_OTHER): Payer: Medicaid Other | Admitting: Obstetrics and Gynecology

## 2021-06-28 ENCOUNTER — Encounter: Payer: Medicaid Other | Admitting: Obstetrics and Gynecology

## 2021-06-28 ENCOUNTER — Encounter: Payer: Self-pay | Admitting: Obstetrics and Gynecology

## 2021-06-28 ENCOUNTER — Encounter (HOSPITAL_COMMUNITY): Payer: Self-pay

## 2021-06-28 VITALS — BP 107/68 | HR 81 | Wt 182.0 lb

## 2021-06-28 DIAGNOSIS — Z3009 Encounter for other general counseling and advice on contraception: Secondary | ICD-10-CM

## 2021-06-28 DIAGNOSIS — Z3480 Encounter for supervision of other normal pregnancy, unspecified trimester: Secondary | ICD-10-CM | POA: Diagnosis not present

## 2021-06-28 DIAGNOSIS — Z98891 History of uterine scar from previous surgery: Secondary | ICD-10-CM

## 2021-06-28 LAB — CULTURE, OB URINE

## 2021-06-28 LAB — GC/CHLAMYDIA PROBE AMP (~~LOC~~) NOT AT ARMC
Chlamydia: NEGATIVE
Comment: NEGATIVE
Comment: NORMAL
Neisseria Gonorrhea: NEGATIVE

## 2021-06-28 NOTE — Patient Instructions (Signed)
Cesarean Delivery, Care After °The following information offers guidance on how to care for yourself after your procedure. Your health care provider may also give you more specific instructions. If you have problems or questions, contact your health care provider. °What can I expect after the procedure? °After the procedure, it is common to have: °A small amount of blood or clear fluid coming from the incision. °Some redness, swelling, and pain in your incision area. °Some abdominal pain and soreness. °Vaginal bleeding (lochia). Even though you did not have a vaginal delivery, you will still have vaginal bleeding and discharge. °Pelvic cramps. °Fatigue. °You may have pain, swelling, and discomfort in the tissue between your vagina and your anus (perineum) if: °Your C-section was unplanned, and you were allowed to labor and push. °An incision was made in the area (episiotomy) or the tissue tore during attempted vaginal delivery. °Follow these instructions at home: °Medicines °Take over-the-counter and prescription medicines only as told by your health care provider. °If you were prescribed an antibiotic medicine, take it as told by your health care provider. Do not stop taking the antibiotic even if you start to feel better. °Ask your health care provider if the medicine prescribed to you requires you to avoid driving or using machinery. °Incision care ° °Follow instructions from your health care provider about how to take care of your incision. Make sure you: °Wash your hands with soap and water for an least 20 seconds before and after you change your bandage (dressing). If soap and water are not available, use hand sanitizer. °If you have a dressing, change it or remove it as told by your health care provider. °Leave stitches (sutures), skin staples, skin glue, or adhesive strips in place. These skin closures may need to stay in place for 2 weeks or longer. If adhesive strip edges start to loosen and curl up, you  may trim the loose edges. Do not remove adhesive strips completely unless your health care provider tells you to do that. °Check your incision area every day for signs of infection. Check for: °More redness, swelling, or pain. °More fluid or blood. °Warmth. °Pus or a bad smell. °Do not take baths, swim, or use a hot tub until your health care provider approves. Ask your health care provider if you may take showers. °When you cough or sneeze, hug a pillow. This helps with pain and decreases the chance of your incision opening up (dehiscing). Do this until your incision heals. °Managing constipation °Your procedure may cause constipation. To prevent or treat constipation, you may need to: °Drink enough fluid to keep your urine pale yellow. °Take over-the-counter or prescription medicines. °Eat foods that are high in fiber, such as beans, whole grains, and fresh fruits and vegetables. °Limit foods that are high in fat and processed sugars, such as fried or sweet foods. °Activity ° °If possible, have someone help you care for your baby and help with household activities for at least a few days after you leave the hospital. °Rest as much as possible. Try to rest or take a nap while your baby is sleeping. °You may have to avoid lifting. Ask your health care provider how much you can safely lift. °Return to your normal activities as told by your health care provider. Ask your health care provider what activities are safe for you. °Talk with your health care provider about when you can engage in sexual activity. This may depend on your: °Risk of infection. °How fast you heal. °Comfort   and desire to engage in sexual activity. °Lifestyle °Do not drink alcohol. This is especially important if you are breastfeeding or taking pain medicine. °Do not use any products that contain nicotine or tobacco. These products include cigarettes, chewing tobacco, and vaping devices, such as e-cigarettes. If you need help quitting, ask your  health care provider. °General instructions °Do not use tampons or douches until your health care provider approves. °Wear loose, comfortable clothing and a supportive and well-fitting bra. °If you pass a blood clot, save it and call your health care provider to discuss. Do not flush blood clots down the toilet before you get instructions from your health care provider. °Keep all follow-up visits for you and your baby. This is important. °Contact a health care provider if: °You have: °A fever. °Dizziness or light-headedness. °Bad-smelling vaginal discharge. °A blood clot pass from your vagina. °Pus, blood, or a bad smell coming from your incision. °An incision that feels warm to the touch. °More redness, swelling, or pain around your incision. °Difficulty or pain when urinating. °Nausea or vomiting. °Little or no interest in activities you used to enjoy. °Your breasts turn red or become painful or hard. °You feel unusually sad or worried. °You have questions about caring for yourself or your baby. °You have redness, swelling, and pain in an arm or leg. °Get help right away if: °You have: °Pain that does not go away or get better with medicine. °Chest pain. °Trouble breathing. °Blurred vision, spots, or flashing lights in your vision. °Thoughts about hurting yourself or your baby. °New pain in your abdomen or in one of your legs. °A severe headache that does not get better with pain medicine. °You faint. °You bleed from your vagina so much that you fill more than one sanitary pad in one hour. Bleeding should not be heavier than your heaviest period. °These symptoms may be an emergency. Get help right away. Call 911. °Do not wait to see if the symptoms will go away. °Do not drive yourself to the hospital. °Get help right away if you feel like you may hurt yourself or others, or have thoughts about taking your own life. Go to your nearest emergency room or: °Call 911. °Call the National Suicide Prevention Lifeline at  1-800-273-8255 or 988. This is open 24 hours a day. °Text the Crisis Text Line at 741741. °Summary °After the procedure, it is common to have pain at your incision site, abdominal cramping, and slight bleeding from your vagina. °Check your incision area every day for signs of infection. °Tell your health care provider about any unusual symptoms. °Keep all follow-up visits for you and your baby. This is important. °This information is not intended to replace advice given to you by your health care provider. Make sure you discuss any questions you have with your health care provider. °Document Revised: 10/13/2020 Document Reviewed: 10/13/2020 °Elsevier Patient Education © 2022 Elsevier Inc. ° °

## 2021-06-28 NOTE — H&P (Signed)
OBSTETRIC ADMISSION HISTORY AND PHYSICAL ? ?Hailey Watson is a 31 y.o. female [phone] with IUP at [redacted]w[redacted]d by LMP presenting for scheduled repeat cesarean section. She reports +FMs, no LOF, no VB, no blurry vision, headaches, peripheral edema, or RUQ pain.  She plans on breast feeding. She requests BTL for birth control postpartum.  ? ?She received her prenatal care at [facility]. ? ?Dating: By LMP --->  Estimated Date of Delivery: 07/08/21 ? ?Sono:   ?@[redacted]w[redacted]d , CWD, normal anatomy, cephalic presentation, anterior placental lie, 3266 g, 45% EFW ? ?Prenatal History/Complications:  ?Hx of CS x2 ?Unwanted fertility ? ?Past Medical History: ?Past Medical History:  ?Diagnosis Date  ? Medical history non-contributory   ? ? ?Past Surgical History: ?Past Surgical History:  ?Procedure Laterality Date  ? CESAREAN SECTION    ? CHOLECYSTECTOMY  04/2019  ? ? ?Obstetrical History: ?OB History   ? ? Gravida  ?5  ? Para  ?2  ? Term  ?2  ? Preterm  ?   ? AB  ?2  ? Living  ?2  ?  ? ? SAB  ?2  ? IAB  ?   ? Ectopic  ?   ? Multiple  ?   ? Live Births  ?2  ?   ?  ? Obstetric Comments  ?Never dilated, "shallow" cervix  ?  ? ?  ? ? ?Social History ?Social History  ? ?Socioeconomic History  ? Marital status: Single  ?  Spouse name: Not on file  ? Number of children: Not on file  ? Years of education: Not on file  ? Highest education level: Not on file  ?Occupational History  ? Not on file  ?Tobacco Use  ? Smoking status: Never  ? Smokeless tobacco: Never  ?Vaping Use  ? Vaping Use: Never used  ?Substance and Sexual Activity  ? Alcohol use: Never  ? Drug use: Not Currently  ?  Types: Marijuana  ?  Comment: last use Mar 26 2022  ? Sexual activity: Not Currently  ?  Birth control/protection: None  ?Other Topics Concern  ? Not on file  ?Social History Narrative  ? Not on file  ? ?Social Determinants of Health  ? ?Financial Resource Strain: Not on file  ?Food Insecurity: Food Insecurity Present  ? Worried About 06-13-1989 in the Last Year:  Often true  ? Ran Out of Food in the Last Year: Often true  ?Transportation Needs: Unmet Transportation Needs  ? Lack of Transportation (Medical): Yes  ? Lack of Transportation (Non-Medical): Yes  ?Physical Activity: Not on file  ?Stress: Not on file  ?Social Connections: Not on file  ? ? ?Family History: ?Family History  ?Problem Relation Age of Onset  ? Healthy Mother   ? Cancer Father   ? ? ?Allergies: ?No Known Allergies ? ?Medications Prior to Admission  ?Medication Sig Dispense Refill Last Dose  ? ferrous sulfate 325 (65 FE) MG tablet Take 1 tablet (325 mg total) by mouth every other day. 45 tablet 2   ? ondansetron (ZOFRAN-ODT) 4 MG disintegrating tablet Take 1 tablet (4 mg total) by mouth every 8 (eight) hours as needed for nausea or vomiting. 15 tablet 0   ? prenatal vitamin w/FE, FA (PRENATAL 1 + 1) 27-1 MG TABS tablet Take 1 tablet by mouth daily at 12 noon. 30 tablet 11   ? terconazole (TERAZOL 7) 0.4 % vaginal cream Place 1 applicator vaginally at bedtime. Use for seven days 45 g  0   ? aspirin 81 MG chewable tablet Chew 1 tablet (81 mg total) by mouth daily. (Patient not taking: Reported on 06/28/2021) 90 tablet 1 Not Taking  ? Blood Pressure Monitoring DEVI 1 each by Does not apply route once a week. 1 each 0   ? ? ? ?Review of Systems  ?All systems reviewed and negative except as stated in HPI ? ?Blood pressure 131/70, pulse 80, temperature 98.1 ?F (36.7 ?C), temperature source Oral, resp. rate 20, height 4\' 6"  (1.372 m), weight 82.6 kg, last menstrual period 10/01/2020, SpO2 100 %. ? ?General appearance: alert, cooperative, and no distress ?Lungs: normal work of breathing on room air  ?Heart: normal rate, warm and well perfused  ?Abdomen: soft, non-tender, gravid, well healed previous CS scar  ?Extremities: no LE edema or calf tenderness to palpation  ? ?Prenatal labs: ?ABO, Rh: --/--/A POS (04/05 1026) ?Antibody: NEG (04/05 1026) ?Rubella: 2.52 (11/14 1210) ?RPR: NON REACTIVE (04/05 1025)  ?HBsAg:  Negative (11/14 1210)  ?HIV: Non Reactive (01/10 0845)  ?GBS:  Negative  ?2 hr Glucola normal  ?Genetic screening - LR NIPS, Horizon negative, AFP negative  ?Anatomy 08-22-1993 normal  ? ?Prenatal Transfer Tool  ?Maternal Diabetes: No ?Genetic Screening: Normal ?Maternal Ultrasounds/Referrals: Normal ?Fetal Ultrasounds or other Referrals:  None ?Maternal Substance Abuse:  No ?Significant Maternal Medications:  None ?Significant Maternal Lab Results: None ? ?Results for orders placed or performed during the hospital encounter of 07/01/21 (from the past 24 hour(s))  ?Prepare RBC (crossmatch)  ? Collection Time: 07/01/21 10:27 AM  ?Result Value Ref Range  ? Order Confirmation    ?  ORDER PROCESSED BY BLOOD BANK ?Performed at [facility], [address] [place], [place] ?  ? ? ?Patient Active Problem List  ? Diagnosis Date Noted  ? S/P cesarean section 07/01/2021  ? Unwanted fertility 06/28/2021  ? History of C-section 02/07/2021  ? Supervision of other normal pregnancy, antepartum 01/13/2021  ? ? ?Assessment/Plan:  ?Hailey Watson is a 31 y.o. 34 at [redacted]w[redacted]d here for scheduled elective repeat cesarean section and BTL for unwanted fertility.  ? ?The risks of surgery were discussed with the patient including but were not limited to: bleeding which may require transfusion or reoperation; infection which may require antibiotics; injury to bowel, bladder, ureters or other surrounding organs; injury to the fetus; need for additional procedures including hysterectomy in the event of a life-threatening hemorrhage; formation of adhesions; placental abnormalities with subsequent pregnancies; incisional problems; thromboembolic phenomenon and other postoperative/anesthesia complications.  The patient concurred with the proposed plan, giving informed written consent for the procedure.    ? ?Patient desires permanent sterilization.  Other reversible forms of contraception were discussed with patient; she declines  all other modalities. Risks of procedure discussed with patient including but not limited to: risk of regret, permanence of method, bleeding, infection, injury to surrounding organs and need for additional procedures.  Failure risk of about 1% with increased risk of ectopic gestation if pregnancy occurs was also discussed with patient.  Also discussed possibility of post-tubal pain syndrome. Patient verbalized understanding of these risks and wants to proceed with sterilization.  Written informed consent obtained.  To OR when ready. ? ?#Pain: Per anesthesia  ?#ID:  GBS negative, Cefotetan for surgical prophylaxis  ?#MOF: Breast  ?#MOC: BTL ?#Circ:  Desires ? ?[redacted]w[redacted]d, MD  ?07/01/2021, 11:49 AM ? ? ? ?

## 2021-06-28 NOTE — Progress Notes (Signed)
Subjective:  ?Hailey Watson is a 31 y.o. [id number] at [redacted]w[redacted]d being seen today for ongoing prenatal care.  She is currently monitored for the following issues for this high-risk pregnancy and has Supervision of other normal pregnancy, antepartum; History of C-section; and Unwanted fertility on their problem list. ? ?Patient reports general discomforts of pregnancy.  Contractions: Irritability. Vag. Bleeding: None.  Movement: Present. Denies leaking of fluid.  ? ?The following portions of the patient's history were reviewed and updated as appropriate: allergies, current medications, past family history, past medical history, past social history, past surgical history and problem list. Problem list updated. ? ?Objective:  ? ?Vitals:  ? 06/28/21 1645 06/28/21 1702  ?BP: (!) 136/107 107/68  ?Pulse: (!) 150 81  ?Weight: 182 lb (82.6 kg)   ? ? ?Fetal Status: Fetal Heart Rate (bpm): 146   Movement: Present    ? ?General:  Alert, oriented and cooperative. Patient is in no acute distress.  ?Skin: Skin is warm and dry. No rash noted.   ?Cardiovascular: Normal heart rate noted  ?Respiratory: Normal respiratory effort, no problems with respiration noted  ?Abdomen: Soft, gravid, appropriate for gestational age. Pain/Pressure: Present     ?Pelvic:  Cervical exam deferred        ?Extremities: Normal range of motion.  Edema: Trace  ?Mental Status: Normal mood and affect. Normal behavior. Normal judgment and thought content.  ? ?Urinalysis:     ? ?Assessment and Plan:  ?Pregnancy: [id number] at [redacted]w[redacted]d ? ?1. Supervision of other normal pregnancy, antepartum ?Stable ? ?2. History of C-section ?For repeat this Friday ? ?3. Unwanted fertility ?BTL with c section on Friday ? ?Term labor symptoms and general obstetric precautions including but not limited to vaginal bleeding, contractions, leaking of fluid and fetal movement were reviewed in detail with the patient. ?Please refer to After Visit Summary for other counseling recommendations.   ?No follow-ups on file. ? ? ?Hermina Staggers, MD ?

## 2021-06-28 NOTE — Patient Instructions (Signed)
Sherri Rad ? 06/28/2021 ? ? Your procedure is scheduled on:  07/01/2021 ? Arrive at 1030 at [employer] on [address] at [facility]  and [address]. You are invited to use the FREE valet parking or use the Visitor's parking deck. ? Pick up the phone at the desk and dial [phone]. ? Call this number if you have problems the morning of surgery: [phone] ? Remember: ? ? Do not eat food:(After Midnight) Desp?s de medianoche. ? Do not drink clear liquids: (After Midnight) Desp?s de medianoche. ? Take these medicines the morning of surgery with A SIP OF WATER:  none ? ? Do not wear jewelry, make-up or nail polish. ? Do not wear lotions, powders, or perfumes. Do not wear deodorant. ? Do not shave 48 hours prior to surgery. ? Do not bring valuables to the hospital.  [facility] is not  ? responsible for any belongings or valuables brought to the hospital. ? Contacts, dentures or bridgework may not be worn into surgery. ? Leave suitcase in the car. After surgery it may be brought to your room. ? For patients admitted to the hospital, checkout time is 11:00 AM the day of  ?            discharge. ? ?   ? Please read over the following fact sheets that you were given:  ?   Preparing for Surgery ? ? ?

## 2021-06-29 ENCOUNTER — Ambulatory Visit (HOSPITAL_COMMUNITY)
Admission: RE | Admit: 2021-06-29 | Discharge: 2021-06-29 | Disposition: A | Discharge status: Home / Self Care | Payer: Medicaid Other | Source: Ambulatory Visit | Attending: Obstetrics & Gynecology | Admitting: Obstetrics & Gynecology

## 2021-06-29 DIAGNOSIS — Z98891 History of uterine scar from previous surgery: Secondary | ICD-10-CM | POA: Diagnosis not present

## 2021-06-29 DIAGNOSIS — Z01812 Encounter for preprocedural laboratory examination: Secondary | ICD-10-CM | POA: Insufficient documentation

## 2021-06-29 LAB — CBC
HCT: 35.5 % — ABNORMAL LOW (ref 36.0–46.0)
Hemoglobin: 11.5 g/dL — ABNORMAL LOW (ref 12.0–15.0)
MCH: 29.6 pg (ref 26.0–34.0)
MCHC: 32.4 g/dL (ref 30.0–36.0)
MCV: 91.3 fL (ref 80.0–100.0)
Platelets: 226 10*3/uL (ref 150–400)
RBC: 3.89 MIL/uL (ref 3.87–5.11)
RDW: 15.4 % (ref 11.5–15.5)
WBC: 7.9 10*3/uL (ref 4.0–10.5)
nRBC: 0 % (ref 0.0–0.2)

## 2021-06-29 LAB — CULTURE, BETA STREP (GROUP B ONLY)

## 2021-06-29 LAB — RPR: RPR Ser Ql: NONREACTIVE

## 2021-07-01 ENCOUNTER — Other Ambulatory Visit: Payer: Self-pay

## 2021-07-01 ENCOUNTER — Inpatient Hospital Stay (HOSPITAL_COMMUNITY)
Admission: RE | Admit: 2021-07-01 | Discharge: 2021-07-03 | DRG: 785 | Disposition: A | Discharge status: Home / Self Care | Payer: Medicaid Other | Attending: Obstetrics & Gynecology | Admitting: Obstetrics & Gynecology

## 2021-07-01 ENCOUNTER — Encounter (HOSPITAL_COMMUNITY)
Admission: RE | Disposition: A | Discharge status: Home / Self Care | Payer: Self-pay | Source: Home / Self Care | Attending: Obstetrics & Gynecology

## 2021-07-01 ENCOUNTER — Inpatient Hospital Stay (HOSPITAL_COMMUNITY): Payer: Medicaid Other | Admitting: Anesthesiology

## 2021-07-01 ENCOUNTER — Encounter (HOSPITAL_COMMUNITY): Payer: Self-pay | Admitting: Obstetrics & Gynecology

## 2021-07-01 DIAGNOSIS — Z302 Encounter for sterilization: Secondary | ICD-10-CM

## 2021-07-01 DIAGNOSIS — Z9851 Tubal ligation status: Secondary | ICD-10-CM

## 2021-07-01 DIAGNOSIS — Z98891 History of uterine scar from previous surgery: Principal | ICD-10-CM

## 2021-07-01 DIAGNOSIS — R030 Elevated blood-pressure reading, without diagnosis of hypertension: Secondary | ICD-10-CM | POA: Diagnosis not present

## 2021-07-01 DIAGNOSIS — O34219 Maternal care for unspecified type scar from previous cesarean delivery: Secondary | ICD-10-CM | POA: Diagnosis not present

## 2021-07-01 DIAGNOSIS — Z3A39 39 weeks gestation of pregnancy: Secondary | ICD-10-CM

## 2021-07-01 DIAGNOSIS — Z3480 Encounter for supervision of other normal pregnancy, unspecified trimester: Secondary | ICD-10-CM

## 2021-07-01 DIAGNOSIS — O99893 Other specified diseases and conditions complicating puerperium: Secondary | ICD-10-CM | POA: Diagnosis not present

## 2021-07-01 DIAGNOSIS — Z3A38 38 weeks gestation of pregnancy: Secondary | ICD-10-CM

## 2021-07-01 DIAGNOSIS — O34211 Maternal care for low transverse scar from previous cesarean delivery: Principal | ICD-10-CM | POA: Diagnosis present

## 2021-07-01 LAB — PREPARE RBC (CROSSMATCH)

## 2021-07-01 SURGERY — Surgical Case
Anesthesia: Spinal | Wound class: Clean Contaminated

## 2021-07-01 MED ORDER — KETOROLAC TROMETHAMINE 30 MG/ML IJ SOLN
30.0000 mg | Freq: Four times a day (QID) | INTRAMUSCULAR | Status: AC
  Administered 2021-07-01 – 2021-07-02 (×3): 30 mg via INTRAVENOUS
  Filled 2021-07-01 (×3): qty 1

## 2021-07-01 MED ORDER — OXYTOCIN-SODIUM CHLORIDE 30-0.9 UT/500ML-% IV SOLN: 2.5000 [IU]/h | INTRAVENOUS | Status: AC

## 2021-07-01 MED ORDER — HYDROMORPHONE HCL 1 MG/ML IJ SOLN
0.2500 mg | INTRAMUSCULAR | Status: DC | PRN
  Administered 2021-07-01: 0.25 mg via INTRAVENOUS

## 2021-07-01 MED ORDER — SOD CITRATE-CITRIC ACID 500-334 MG/5ML PO SOLN
ORAL | Status: AC
  Filled 2021-07-01: qty 30

## 2021-07-01 MED ORDER — MEASLES, MUMPS & RUBELLA VAC IJ SOLR: 0.5000 mL | Freq: Once | INTRAMUSCULAR | Status: DC

## 2021-07-01 MED ORDER — KETOROLAC TROMETHAMINE 30 MG/ML IJ SOLN
INTRAMUSCULAR | Status: AC
  Filled 2021-07-01: qty 1

## 2021-07-01 MED ORDER — SENNOSIDES-DOCUSATE SODIUM 8.6-50 MG PO TABS
2.0000 | ORAL_TABLET | Freq: Every day | ORAL | Status: DC
  Administered 2021-07-02: 2 via ORAL
  Filled 2021-07-01: qty 2

## 2021-07-01 MED ORDER — PHENYLEPHRINE 40 MCG/ML (10ML) SYRINGE FOR IV PUSH (FOR BLOOD PRESSURE SUPPORT)
PREFILLED_SYRINGE | INTRAVENOUS | Status: AC
  Filled 2021-07-01: qty 10

## 2021-07-01 MED ORDER — ONDANSETRON HCL 4 MG/2ML IJ SOLN
INTRAMUSCULAR | Status: AC
  Filled 2021-07-01: qty 2

## 2021-07-01 MED ORDER — DEXAMETHASONE SODIUM PHOSPHATE 4 MG/ML IJ SOLN
INTRAMUSCULAR | Status: DC | PRN
  Administered 2021-07-01: 10 mg via INTRAVENOUS

## 2021-07-01 MED ORDER — GABAPENTIN 300 MG PO CAPS
300.0000 mg | ORAL_CAPSULE | ORAL | Status: AC
  Administered 2021-07-01: 300 mg via ORAL

## 2021-07-01 MED ORDER — BUPIVACAINE IN DEXTROSE 0.75-8.25 % IT SOLN
INTRATHECAL | Status: DC | PRN
  Administered 2021-07-01: 1.4 mL via INTRATHECAL

## 2021-07-01 MED ORDER — OXYCODONE-ACETAMINOPHEN 5-325 MG PO TABS: 2.0000 | ORAL_TABLET | ORAL | Status: DC | PRN

## 2021-07-01 MED ORDER — PROMETHAZINE HCL 25 MG/ML IJ SOLN
INTRAMUSCULAR | Status: AC
  Filled 2021-07-01: qty 1

## 2021-07-01 MED ORDER — FENTANYL CITRATE (PF) 100 MCG/2ML IJ SOLN
INTRAMUSCULAR | Status: AC
  Filled 2021-07-01: qty 2

## 2021-07-01 MED ORDER — OXYTOCIN-SODIUM CHLORIDE 30-0.9 UT/500ML-% IV SOLN
INTRAVENOUS | Status: DC | PRN
Start: 2021-07-01 — End: 2021-07-01
  Administered 2021-07-01: 400 mL via INTRAVENOUS

## 2021-07-01 MED ORDER — SODIUM CHLORIDE 0.9 % IV SOLN
INTRAVENOUS | Status: AC
  Filled 2021-07-01: qty 2

## 2021-07-01 MED ORDER — IBUPROFEN 600 MG PO TABS
600.0000 mg | ORAL_TABLET | Freq: Four times a day (QID) | ORAL | Status: DC
  Administered 2021-07-02 – 2021-07-03 (×3): 600 mg via ORAL
  Filled 2021-07-01 (×3): qty 1

## 2021-07-01 MED ORDER — LACTATED RINGERS IV SOLN: INTRAVENOUS | Status: DC | PRN

## 2021-07-01 MED ORDER — ONDANSETRON HCL 4 MG/2ML IJ SOLN
INTRAMUSCULAR | Status: DC | PRN
  Administered 2021-07-01: 4 mg via INTRAVENOUS

## 2021-07-01 MED ORDER — OXYCODONE HCL 5 MG PO TABS
5.0000 mg | ORAL_TABLET | ORAL | Status: DC | PRN
  Administered 2021-07-02: 10 mg via ORAL
  Filled 2021-07-01: qty 2

## 2021-07-01 MED ORDER — LACTATED RINGERS IV SOLN: INTRAVENOUS | Status: DC

## 2021-07-01 MED ORDER — ZOLPIDEM TARTRATE 5 MG PO TABS: 5.0000 mg | ORAL_TABLET | Freq: Every evening | ORAL | Status: DC | PRN

## 2021-07-01 MED ORDER — OXYTOCIN-SODIUM CHLORIDE 30-0.9 UT/500ML-% IV SOLN
INTRAVENOUS | Status: AC
  Filled 2021-07-01: qty 500

## 2021-07-01 MED ORDER — TRANEXAMIC ACID-NACL 1000-0.7 MG/100ML-% IV SOLN
INTRAVENOUS | Status: AC
  Filled 2021-07-01: qty 100

## 2021-07-01 MED ORDER — PROMETHAZINE HCL 25 MG/ML IJ SOLN
6.2500 mg | INTRAMUSCULAR | Status: DC | PRN
  Administered 2021-07-01: 6.25 mg via INTRAVENOUS

## 2021-07-01 MED ORDER — OXYCODONE HCL 5 MG PO TABS: 5.0000 mg | ORAL_TABLET | Freq: Once | ORAL | Status: DC | PRN

## 2021-07-01 MED ORDER — FENTANYL CITRATE (PF) 100 MCG/2ML IJ SOLN
INTRAMUSCULAR | Status: DC | PRN
Start: 2021-07-01 — End: 2021-07-01
  Administered 2021-07-01: 15 ug via INTRATHECAL

## 2021-07-01 MED ORDER — PHENYLEPHRINE HCL-NACL 20-0.9 MG/250ML-% IV SOLN
INTRAVENOUS | Status: AC
  Filled 2021-07-01: qty 250

## 2021-07-01 MED ORDER — STERILE WATER FOR IRRIGATION IR SOLN
Status: DC | PRN
  Administered 2021-07-01: 1

## 2021-07-01 MED ORDER — MENTHOL 3 MG MT LOZG: 1.0000 | LOZENGE | OROMUCOSAL | Status: DC | PRN

## 2021-07-01 MED ORDER — GABAPENTIN 300 MG PO CAPS
300.0000 mg | ORAL_CAPSULE | Freq: Two times a day (BID) | ORAL | Status: DC
  Administered 2021-07-01 – 2021-07-02 (×3): 300 mg via ORAL
  Filled 2021-07-01 (×3): qty 1

## 2021-07-01 MED ORDER — SCOPOLAMINE 1 MG/3DAYS TD PT72
MEDICATED_PATCH | TRANSDERMAL | Status: DC | PRN
  Administered 2021-07-01: 1 via TRANSDERMAL

## 2021-07-01 MED ORDER — ACETAMINOPHEN 500 MG PO TABS
ORAL_TABLET | ORAL | Status: AC
  Filled 2021-07-01: qty 2

## 2021-07-01 MED ORDER — MORPHINE SULFATE (PF) 0.5 MG/ML IJ SOLN
INTRAMUSCULAR | Status: DC | PRN
  Administered 2021-07-01: 150 ug via INTRATHECAL

## 2021-07-01 MED ORDER — ENOXAPARIN SODIUM 40 MG/0.4ML IJ SOSY
40.0000 mg | PREFILLED_SYRINGE | INTRAMUSCULAR | Status: DC
  Administered 2021-07-02: 40 mg via SUBCUTANEOUS
  Filled 2021-07-01: qty 0.4

## 2021-07-01 MED ORDER — GABAPENTIN 300 MG PO CAPS
ORAL_CAPSULE | ORAL | Status: AC
  Filled 2021-07-01: qty 1

## 2021-07-01 MED ORDER — HYDROMORPHONE HCL 1 MG/ML IJ SOLN: 1.0000 mg | INTRAMUSCULAR | Status: DC | PRN

## 2021-07-01 MED ORDER — DIPHENHYDRAMINE HCL 50 MG/ML IJ SOLN
INTRAMUSCULAR | Status: DC | PRN
  Administered 2021-07-01: 6.25 mg via INTRAVENOUS

## 2021-07-01 MED ORDER — TRANEXAMIC ACID-NACL 1000-0.7 MG/100ML-% IV SOLN: 1000.0000 mg | INTRAVENOUS | Status: DC

## 2021-07-01 MED ORDER — SIMETHICONE 80 MG PO CHEW: 80.0000 mg | CHEWABLE_TABLET | ORAL | Status: DC | PRN

## 2021-07-01 MED ORDER — PRENATAL MULTIVITAMIN CH
1.0000 | ORAL_TABLET | Freq: Every day | ORAL | Status: DC
  Administered 2021-07-02: 1 via ORAL
  Filled 2021-07-01: qty 1

## 2021-07-01 MED ORDER — METOCLOPRAMIDE HCL 5 MG/ML IJ SOLN
INTRAMUSCULAR | Status: DC | PRN
  Administered 2021-07-01: 10 mg via INTRAVENOUS

## 2021-07-01 MED ORDER — OXYCODONE HCL 5 MG/5ML PO SOLN: 5.0000 mg | Freq: Once | ORAL | Status: DC | PRN

## 2021-07-01 MED ORDER — SCOPOLAMINE 1 MG/3DAYS TD PT72
MEDICATED_PATCH | TRANSDERMAL | Status: AC
Start: 2021-07-01 — End: ?
  Filled 2021-07-01: qty 1

## 2021-07-01 MED ORDER — PHENYLEPHRINE HCL (PRESSORS) 10 MG/ML IV SOLN
INTRAVENOUS | Status: DC | PRN
  Administered 2021-07-01: 160 ug via INTRAVENOUS

## 2021-07-01 MED ORDER — SOD CITRATE-CITRIC ACID 500-334 MG/5ML PO SOLN
30.0000 mL | ORAL | Status: AC
  Administered 2021-07-01: 30 mL via ORAL

## 2021-07-01 MED ORDER — TETANUS-DIPHTH-ACELL PERTUSSIS 5-2.5-18.5 LF-MCG/0.5 IM SUSY: 0.5000 mL | PREFILLED_SYRINGE | Freq: Once | INTRAMUSCULAR | Status: DC

## 2021-07-01 MED ORDER — POVIDONE-IODINE 10 % EX SWAB
2.0000 "application " | Freq: Once | CUTANEOUS | Status: AC
  Administered 2021-07-01: 2 via TOPICAL

## 2021-07-01 MED ORDER — FERROUS SULFATE 325 (65 FE) MG PO TABS
325.0000 mg | ORAL_TABLET | ORAL | Status: DC
  Administered 2021-07-02: 325 mg via ORAL
  Filled 2021-07-01: qty 1

## 2021-07-01 MED ORDER — DEXAMETHASONE SODIUM PHOSPHATE 4 MG/ML IJ SOLN
INTRAMUSCULAR | Status: AC
  Filled 2021-07-01: qty 2

## 2021-07-01 MED ORDER — MORPHINE SULFATE (PF) 0.5 MG/ML IJ SOLN
INTRAMUSCULAR | Status: AC
  Filled 2021-07-01: qty 10

## 2021-07-01 MED ORDER — WITCH HAZEL-GLYCERIN EX PADS: 1.0000 "application " | MEDICATED_PAD | CUTANEOUS | Status: DC | PRN

## 2021-07-01 MED ORDER — DIBUCAINE (PERIANAL) 1 % EX OINT: 1.0000 "application " | TOPICAL_OINTMENT | CUTANEOUS | Status: DC | PRN

## 2021-07-01 MED ORDER — KETOROLAC TROMETHAMINE 30 MG/ML IJ SOLN
30.0000 mg | Freq: Once | INTRAMUSCULAR | Status: AC | PRN
  Administered 2021-07-01: 30 mg via INTRAVENOUS

## 2021-07-01 MED ORDER — SODIUM CHLORIDE 0.9 % IR SOLN
Status: DC | PRN
  Administered 2021-07-01: 1000 mL

## 2021-07-01 MED ORDER — DIPHENHYDRAMINE HCL 25 MG PO CAPS: 25.0000 mg | ORAL_CAPSULE | Freq: Four times a day (QID) | ORAL | Status: DC | PRN

## 2021-07-01 MED ORDER — SODIUM CHLORIDE 0.9 % IV SOLN
2.0000 g | INTRAVENOUS | Status: AC
  Administered 2021-07-01: 2 g via INTRAVENOUS

## 2021-07-01 MED ORDER — HYDROMORPHONE HCL 1 MG/ML IJ SOLN
INTRAMUSCULAR | Status: AC
  Filled 2021-07-01: qty 0.5

## 2021-07-01 MED ORDER — PHENYLEPHRINE HCL-NACL 20-0.9 MG/250ML-% IV SOLN
INTRAVENOUS | Status: DC | PRN
  Administered 2021-07-01: 60 ug/min via INTRAVENOUS

## 2021-07-01 MED ORDER — MAGNESIUM HYDROXIDE 400 MG/5ML PO SUSP: 30.0000 mL | ORAL | Status: DC | PRN

## 2021-07-01 MED ORDER — ACETAMINOPHEN 500 MG PO TABS
1000.0000 mg | ORAL_TABLET | ORAL | Status: AC
  Administered 2021-07-01: 1000 mg via ORAL

## 2021-07-01 MED ORDER — METOCLOPRAMIDE HCL 5 MG/ML IJ SOLN
INTRAMUSCULAR | Status: AC
  Filled 2021-07-01: qty 2

## 2021-07-01 MED ORDER — TRANEXAMIC ACID-NACL 1000-0.7 MG/100ML-% IV SOLN
INTRAVENOUS | Status: DC | PRN
  Administered 2021-07-01: 1000 mg via INTRAVENOUS

## 2021-07-01 MED ORDER — ACETAMINOPHEN 500 MG PO TABS
1000.0000 mg | ORAL_TABLET | Freq: Four times a day (QID) | ORAL | Status: DC
  Administered 2021-07-01 – 2021-07-02 (×6): 1000 mg via ORAL
  Filled 2021-07-01 (×7): qty 2

## 2021-07-01 MED ORDER — COCONUT OIL OIL: 1.0000 "application " | TOPICAL_OIL | Status: DC | PRN

## 2021-07-01 SURGICAL SUPPLY — 34 items
CHLORAPREP W/TINT 26ML (MISCELLANEOUS) ×4 IMPLANT
CLAMP CORD UMBIL (MISCELLANEOUS) ×2 IMPLANT
CLIP FILSHIE TUBAL LIGA STRL (Clip) ×1 IMPLANT
CLOTH BEACON ORANGE TIMEOUT ST (SAFETY) ×2 IMPLANT
DRSG OPSITE POSTOP 4X10 (GAUZE/BANDAGES/DRESSINGS) ×2 IMPLANT
DRSG PAD ABDOMINAL 8X10 ST (GAUZE/BANDAGES/DRESSINGS) ×1 IMPLANT
ELECT REM PT RETURN 9FT ADLT (ELECTROSURGICAL) ×2
ELECTRODE REM PT RTRN 9FT ADLT (ELECTROSURGICAL) ×1 IMPLANT
EXTRACTOR VACUUM M CUP 4 TUBE (SUCTIONS) IMPLANT
GLOVE BIOGEL PI IND STRL 7.0 (GLOVE) ×3 IMPLANT
GLOVE BIOGEL PI INDICATOR 7.0 (GLOVE) ×3
GLOVE ECLIPSE 7.0 STRL STRAW (GLOVE) ×2 IMPLANT
GOWN STRL REUS W/TWL LRG LVL3 (GOWN DISPOSABLE) ×4 IMPLANT
KIT ABG SYR 3ML LUER SLIP (SYRINGE) IMPLANT
NDL HYPO 25X5/8 SAFETYGLIDE (NEEDLE) ×1 IMPLANT
NEEDLE HYPO 22GX1.5 SAFETY (NEEDLE) ×2 IMPLANT
NEEDLE HYPO 25X5/8 SAFETYGLIDE (NEEDLE) ×2 IMPLANT
NS IRRIG 1000ML POUR BTL (IV SOLUTION) ×2 IMPLANT
PACK C SECTION WH (CUSTOM PROCEDURE TRAY) ×2 IMPLANT
PAD ABD 7.5X8 STRL (GAUZE/BANDAGES/DRESSINGS) ×2 IMPLANT
PAD OB MATERNITY 4.3X12.25 (PERSONAL CARE ITEMS) ×2 IMPLANT
PENCIL SMOKE EVAC W/HOLSTER (ELECTROSURGICAL) IMPLANT
RTRCTR C-SECT PINK 25CM LRG (MISCELLANEOUS) IMPLANT
SPONGE GAUZE 4X4 FOR O.R. (GAUZE/BANDAGES/DRESSINGS) ×2 IMPLANT
SUT PDS AB 0 CTX 36 PDP370T (SUTURE) IMPLANT
SUT PLAIN 2 0 XLH (SUTURE) IMPLANT
SUT VIC AB 0 CTX 36 (SUTURE) ×2
SUT VIC AB 0 CTX36XBRD ANBCTRL (SUTURE) ×2 IMPLANT
SUT VIC AB 4-0 KS 27 (SUTURE) ×2 IMPLANT
SUT VIC AB 4-0 PS2 18 (SUTURE) ×1 IMPLANT
SYR CONTROL 10ML LL (SYRINGE) ×2 IMPLANT
TOWEL OR 17X24 6PK STRL BLUE (TOWEL DISPOSABLE) ×2 IMPLANT
TRAY FOLEY W/BAG SLVR 14FR LF (SET/KITS/TRAYS/PACK) ×2 IMPLANT
WATER STERILE IRR 1000ML POUR (IV SOLUTION) ×2 IMPLANT

## 2021-07-01 NOTE — Lactation Note (Signed)
This note was copied from a baby's chart. ?Lactation Consultation Note ? ?Patient Name: Hailey Watson ?Today's Date: 07/01/2021 ?  ?Age:33 hours ? ?LC checked in with RN, Hailey Watson, Mom recent fed 5 ml of formula. Mom eating right now. Mom to call for latch assistance with next feeding.  ? ?Maternal Data ?  ? ?Feeding ?Nipple Type: Slow - flow ? ?LATCH Score ?  ? ?  ? ?  ? ?  ? ?  ? ?  ? ? ?Lactation Tools Discussed/Used ?  ? ?Interventions ?  ? ?Discharge ?  ? ?Consult Status ?  ? ? ? ?Thula Stewart  Nicholson-Springer ?07/01/2021, 5:46 PM ? ? ? ?

## 2021-07-01 NOTE — Lactation Note (Signed)
This note was copied from a baby's chart. ?Lactation Consultation Note ? ?Patient Name: Hailey Watson ?Today's Date: 07/01/2021 ?Reason for consult: Initial assessment;Mother's request;Difficult latch;1st time breastfeeding;Term;Breastfeeding assistance ?Age:33 hours ? ?Infant only latching on right side. Left breast inverted. LC provided hand pump for her to pre pump with it before latching.  ? ?Plan 1. To feed based on cues 8-12x 24hr period. Mom to offer breasts ?2. Mom to supplement 5-7 ml per feeding ?3. Manual pump q 3hrs for 10 min each breast ?4. I and O sheet reviewed.  ?All questions answered at the end of the visit.  ? ?Maternal Data ?Has patient been taught Hand Expression?: Yes ?Does the patient have breastfeeding experience prior to this delivery?: No ? ?Feeding ?Mother's Current Feeding Choice: Breast Milk ?Nipple Type: Slow - flow ? ?LATCH Score ?Latch: Repeated attempts needed to sustain latch, nipple held in mouth throughout feeding, stimulation needed to elicit sucking reflex. ? ?Audible Swallowing: Spontaneous and intermittent ? ?Type of Nipple: Everted at rest and after stimulation (right everted  and left inverted) ? ?Comfort (Breast/Nipple): Soft / non-tender ? ?Hold (Positioning): Assistance needed to correctly position infant at breast and maintain latch. ? ?LATCH Score: 8 ? ? ?Lactation Tools Discussed/Used ?Tools: Pump;Flanges ?Flange Size: 24 ?Breast pump type: Manual ?Pump Education: Setup, frequency, and cleaning;Milk Storage ?Reason for Pumping: increase stimulation ?Pumping frequency: every 3 hrs for 10 min each breast ? ?Interventions ?Interventions: Breast feeding basics reviewed;Assisted with latch;Skin to skin;Breast massage;Hand express;Pre-pump if needed;Breast compression;Adjust position;Support pillows;Position options;Expressed milk;Hand pump;Education;Pace feeding;LC Magazine features editor;Infant Driven Feeding Algorithm education ? ?Discharge ?Pump: Manual ?WIC Program:  No ? ?Consult Status ?Consult Status: Follow-up ?Date: 07/02/21 ?Follow-up type: In-patient ? ? ? ?Carolyn Maniscalco  Nicholson-Springer ?07/01/2021, 8:28 PM ? ? ? ?

## 2021-07-01 NOTE — Anesthesia Procedure Notes (Signed)
Spinal ? ?Patient location during procedure: OB ?Start time: 07/01/2021 12:36 PM ?End time: 07/01/2021 12:44 PM ?Reason for block: surgical anesthesia ?Staffing ?Performed: anesthesiologist  ?Anesthesiologist: [name], MD ?Preanesthetic Checklist ?Completed: patient identified, IV checked, risks and benefits discussed, surgical consent, monitors and equipment checked, pre-op evaluation and timeout performed ?Spinal Block ?Patient position: sitting ?Prep: DuraPrep and site prepped and draped ?Patient monitoring: heart rate, cardiac monitor, continuous pulse ox and blood pressure ?Approach: midline ?Location: L3-4 ?Injection technique: single-shot ?Needle ?Needle type: Pencan  ?Needle gauge: 24 G ?Needle length: 10 cm ?Assessment ?Sensory level: T4 ?Events: CSF return ?Additional Notes ?SAB placed by SRNA under direct supervision ? ? ? ?

## 2021-07-01 NOTE — Op Note (Signed)
Annamarie Dawley ?PROCEDURE DATE: 07/01/2021 ? ?PREOPERATIVE DIAGNOSES: Intrauterine pregnancy at [redacted]w[redacted]d weeks gestation;  previous cesarean section x 2 ; undesired fertility ? ?POSTOPERATIVE DIAGNOSES: The same ? ?PROCEDURE: Low Transverse Cesarean Section, Bilateral Tubal Sterilization using Filshie clips ? ?SURGEON:  Dr. Jaynie Collins ? ?ASSISTANT:  Dr. Evalina Field ? ?INDICATIONS: Hailey Watson is a 31 y.o. [id number] at [redacted]w[redacted]d here for cesarean section and bilateral tubal sterilization secondary to the indications listed under preoperative diagnoses; please see preoperative note for further details.  The risks of surgery were discussed with the patient including but were not limited to: bleeding which may require transfusion or reoperation; infection which may require antibiotics; injury to bowel, bladder, ureters or other surrounding organs; injury to the fetus; need for additional procedures including hysterectomy in the event of a life-threatening hemorrhage; formation of adhesions; placental abnormalities wth subsequent pregnancies; incisional problems; thromboembolic phenomenon and other postoperative/anesthesia complications.  Patient also desires permanent sterilization.  Other reversible forms of contraception were discussed with patient; she declines all other modalities.   Risks of sterilization procedure discussed with patient including but not limited to: risk of regret, permanence of method, bleeding, infection, injury to surrounding organs and need for additional procedures.  Failure risk of about 1% with increased risk of ectopic gestation if pregnancy occurs was also discussed with patient.  Also discussed possibility of post-tubal pain syndrome. The patient concurred with the proposed plan, giving informed written consent for the procedures. ? ?FINDINGS:  Viable female infant in cephalic presentation.  Apgars pending at time of note, baby stable with parents in the OR.  Clear amniotic fluid.   Intact placenta, three vessel cord.  Normal uterus, fallopian tubes and ovaries bilaterally. Fallopian tubes were sterilized bilaterally with Filshie clips.  Unable to do salpingectomy given edematous mesosalpinx underneath both tubes, and left fallopian tube was short and very adherent to its edematous mesosalpinx.  Minimal intraperitoneal adhesive disease. ? ?ANESTHESIA: Spinal ?ESTIMATED BLOOD LOSS: 200 ml ?SPECIMENS: Placenta sent to L&D ?COMPLICATIONS: None immediate ? ?PROCEDURE IN DETAIL:  The patient preoperatively received intravenous antibiotics and had sequential compression devices applied to her lower extremities.   She was then taken to the operating room where spinal anesthesia was administered and was found to be adequate. She was then placed in a dorsal supine position with a leftward tilt, and prepped and draped in a sterile manner.  A foley catheter was placed into her bladder and attached to constant gravity.  After an adequate timeout was performed, a Pfannenstiel skin incision was made with scalpel over her preexisting scar and carried through to the underlying layer of fascia. The fascia was incised in the midline, and this incision was extended bilaterally using the Mayo scissors.  Kocher clamps were applied to the superior aspect of the fascial incision and the underlying rectus muscles were dissected off bluntly and sharply.  The rectus muscles were separated in the midline and the peritoneum was entered bluntly. The Alexis self-retaining retractor was introduced into the abdominal cavity.  Attention was turned to the lower uterine segment where a low transverse hysterotomy was made with a scalpel and extended bilaterally bluntly.  The infant was successfully delivered, the cord was clamped and cut after one minute, and the infant was handed over to the awaiting neonatology team. Uterine massage was then administered, and the placenta delivered intact with a three-vessel cord. The uterus  was then cleared of clots and debris.  The hysterotomy was closed with 0 Vicryl in  a running locked fashion, and an imbricating layer was also placed with 0 Vicryl.  Attention was then turned to the fallopian tubes, and Filshie clips were placed about 3 cm from the cornua of the right fallopian tube, and about 5 cm from the cornua on the left both fallopian tubes, with care given to incorporate the clear part of the underlying mesosalpinx on both sides, allowing for bilateral tubal sterilization. Good hemostasis was noted. The pelvis was cleared of all clot and debris.  The retractor was removed.  The peritoneum was closed with a 0 Vicryl running stitch and the rectus muscles were reapproximated using 0 Vicryl interrupted stitches. The fascia was then closed using 0 PDS in a running fashion.  The subcutaneous layer was irrigated, and the skin was closed with a 4-0 Vicryl subcuticular stitch. The patient tolerated the procedure well. Sponge, instrument and needle counts were correct x 3.  She was taken to the recovery room in stable condition.  ? ? ?Jaynie Collins, MD, FACOG ?Obstetrician Heritage manager, Faculty Practice ?Center for [employer], [facility] ? ? ? ?

## 2021-07-01 NOTE — Anesthesia Preprocedure Evaluation (Signed)
Anesthesia Evaluation  ?Patient identified by MRN, date of birth, ID band ?Patient awake ? ? ? ?Reviewed: ?Allergy & Precautions, NPO status , Patient's Chart, lab work & pertinent test results ? ?Airway ?Mallampati: II ? ?TM Distance: >3 FB ?Neck ROM: Full ? ? ? Dental ?no notable dental hx. ? ?  ?Pulmonary ?neg pulmonary ROS, Patient abstained from smoking.,  ?  ?Pulmonary exam normal ?breath sounds clear to auscultation ? ? ? ? ? ? Cardiovascular ?negative cardio ROS ?Normal cardiovascular exam ?Rhythm:Regular Rate:Normal ? ? ?  ?Neuro/Psych ?negative neurological ROS ? negative psych ROS  ? GI/Hepatic ?negative GI ROS, Neg liver ROS,   ?Endo/Other  ?negative endocrine ROS ? Renal/GU ?negative Renal ROS  ?negative genitourinary ?  ?Musculoskeletal ?negative musculoskeletal ROS ?(+)  ? Abdominal ?(+) + obese,   ?Peds ?negative pediatric ROS ?(+)  Hematology ?negative hematology ROS ?(+)   ?Anesthesia Other Findings ? ? Reproductive/Obstetrics ?(+) Pregnancy ? ?  ? ? ? ? ? ? ? ? ? ? ? ? ? ?  ?  ? ? ? ? ? ? ? ? ?Anesthesia Physical ?Anesthesia Plan ? ?ASA: 3 ? ?Anesthesia Plan: Spinal  ? ?Post-op Pain Management:   ? ?Induction:  ? ?PONV Risk Score and Plan: Treatment may vary due to age or medical condition ? ?Airway Management Planned:  ? ?Additional Equipment:  ? ?Intra-op Plan:  ? ?Post-operative Plan:  ? ?Informed Consent: I have reviewed the patients History and Physical, chart, labs and discussed the procedure including the risks, benefits and alternatives for the proposed anesthesia with the patient or authorized representative who has indicated his/her understanding and acceptance.  ? ? ? ?Dental advisory given ? ?Plan Discussed with: CRNA ? ?Anesthesia Plan Comments:   ? ? ? ? ? ? ?Anesthesia Quick Evaluation ? ?

## 2021-07-01 NOTE — Discharge Summary (Signed)
Postpartum Discharge Summary ? ? ?   ?Patient Name: Hailey Watson ?DOB: 09/08/1990 ?MRN: [id number] ? ?Date of admission: 07/01/2021 ?Delivery date:07/01/2021  ?Delivering provider: ANYANWU, UGONNA A  ?Date of discharge: 07/03/2021 ? ?Admitting diagnosis: S/P cesarean section [Z98.891] ?Intrauterine pregnancy: [redacted]w[redacted]d     ?Secondary diagnosis:  Principal Problem: ?  S/P cesarean section ?Active Problems: ?  Supervision of other normal pregnancy, antepartum ?  History of C-section ?  S/P tubal ligation ? ?Additional problems: None   ?Discharge diagnosis: Term Pregnancy Delivered                                              ?Post partum procedures:  None ?Augmentation: N/A ?Complications: None ? ?Hospital course: Sceduled C/S - 30 y.o. yo G5P2022 at [redacted]w[redacted]d was admitted to the hospital 07/01/2021 for scheduled cesarean section with the following indication: Elective Repeat. Delivery details are as follows:  ?Membrane Rupture Time/Date: 1:14 PM ,07/01/2021   ?Delivery Method:C-Section, Low Transverse  ?Details of operation can be found in separate operative note.  Patient had an uncomplicated postpartum course.  Her hemoglobin on POD#1 was 10.5, for which she received oral iron therapy.  She is ambulating, tolerating a regular diet, passing flatus, and urinating well.  Patient is discharged home in stable condition on  07/03/21 ?       ?Newborn Data: ?Birth date:07/01/2021  ?Birth time:1:14 PM  ?Gender:Female  ?Living status:Living  ?Apgars:9 ,9  ?Weight:3230 g    ? ?Magnesium Sulfate received: No ?BMZ received: No ?Rhophylac: N/A ?MMR: N/A ?T-DaP: Given prenatally ?Flu: No ?Transfusion: No  ? ?Physical exam  ?Vitals:  ? 07/02/21 0312 07/02/21 1854 07/02/21 2052 07/03/21 0541  ?BP: (!) 125/51 123/78 111/60 (!) 119/49  ?Pulse: (!) 48 67 69 (!) 59  ?Resp: 18 16 18 18  ?Temp: 98.2 ?F (36.8 ?C) 97.9 ?F (36.6 ?C) 98.5 ?F (36.9 ?C) 98 ?F (36.7 ?C)  ?TempSrc: Oral Oral Oral Oral  ?SpO2: 100% 100% 100%   ?Weight:      ?Height:       ? ?General: alert, cooperative, and no distress ?Lochia: appropriate ?Uterine Fundus: firm ?Incision: Healing well with no significant drainage, Dressing is clean, dry, and intact ?DVT Evaluation: No evidence of DVT seen on physical exam. ?Negative Homan's sign. ?No cords or calf tenderness. ? ?Labs: ?Lab Results  ?Component Value Date  ? WBC 18.4 (H) 07/02/2021  ? HGB 10.5 (L) 07/02/2021  ? HCT 31.1 (L) 07/02/2021  ? MCV 89.1 07/02/2021  ? PLT 200 07/02/2021  ? ? ?  Latest Ref Rng & Units 07/02/2021  ?  5:18 AM  ?CMP  ?Glucose 70 - 99 mg/dL 87    ?BUN 6 - 20 mg/dL 5    ?Creatinine 0.44 - 1.00 mg/dL 0.72    ? 0.69    ?Sodium 135 - 145 mmol/L 134    ?Potassium 3.5 - 5.1 mmol/L 4.0    ?Chloride 98 - 111 mmol/L 107    ?CO2 22 - 32 mmol/L 21    ?Calcium 8.9 - 10.3 mg/dL 8.3    ?Total Protein 6.5 - 8.1 g/dL 5.5    ?Total Bilirubin 0.3 - 1.2 mg/dL 0.5    ?Alkaline Phos 38 - 126 U/L 75    ?AST 15 - 41 U/L 103    ?ALT 0 - 44 U/L 146    ? ?  Edinburgh Score: ? ?  07/02/2021  ? 10:10 AM  ?Edinburgh Postnatal Depression Scale Screening Tool  ?I have been able to laugh and see the funny side of things. 0  ?I have looked forward with enjoyment to things. 0  ?I have blamed myself unnecessarily when things went wrong. 0  ?I have been anxious or worried for no good reason. 0  ?I have felt scared or panicky for no good reason. 0  ?Things have been getting on top of me. 0  ?I have been so unhappy that I have had difficulty sleeping. 0  ?I have felt sad or miserable. 0  ?I have been so unhappy that I have been crying. 0  ?The thought of harming myself has occurred to me. 0  ?Edinburgh Postnatal Depression Scale Total 0  ? ? ? ?After visit meds:  ?Allergies as of 07/03/2021   ?No Known Allergies ?  ? ?  ?Medication List  ?  ? ?STOP taking these medications   ? ?aspirin 81 MG chewable tablet ?  ?ondansetron 4 MG disintegrating tablet ?Commonly known as: ZOFRAN-ODT ?  ? ?  ? ?TAKE these medications   ? ?Blood Pressure Monitoring Devi ?1 each by  Does not apply route once a week. ?  ?ferrous sulfate 325 (65 FE) MG tablet ?Take 1 tablet (325 mg total) by mouth every other day. ?  ?ibuprofen 600 MG tablet ?Commonly known as: ADVIL ?Take 1 tablet (600 mg total) by mouth every 6 (six) hours as needed for mild pain, moderate pain or cramping. ?  ?oxyCODONE-acetaminophen 5-325 MG tablet ?Commonly known as: PERCOCET/ROXICET ?Take 1 tablet by mouth every 6 (six) hours as needed for severe pain ((when tolerating fluids)). ?  ?prenatal vitamin w/FE, FA 27-1 MG Tabs tablet ?Take 1 tablet by mouth daily at 12 noon. ?  ?senna-docusate 8.6-50 MG tablet ?Commonly known as: Senokot-S ?Take 2 tablets by mouth 2 (two) times daily. ?  ?terconazole 0.4 % vaginal cream ?Commonly known as: TERAZOL 7 ?Place 1 applicator vaginally at bedtime. Use for seven days ?  ? ?  ? ?  ?  ? ? ?  ?Discharge Care Instructions  ?(From admission, onward)  ?  ? ? ?  ? ?  Start     Ordered  ? 07/03/21 0000  Discharge wound care:       ?Comments: As per discharge handout and nursing instructions  ? 07/03/21 1032  ? ?  ?  ? ?  ? ? ? ?Discharge home in stable condition ?Infant Feeding: Breast ?Infant Disposition:home with mother ?Discharge instruction: per After Visit Summary and Postpartum booklet. ?Activity: Advance as tolerated. Pelvic rest for 6 weeks.  ?Diet: routine diet ?Future Appointments: ?Future Appointments  ?Date Time Provider [place]  ?07/08/2021 10:20 AM [facility] NURSE [facility]  ?08/04/2021 10:15 AM Chancy Milroy, MD [facility]  ? ?Follow up Visit: ?Message sent to [facility] by Dr. Gwenlyn Perking on 07/01/21.  ? ?Please schedule this patient for a In person postpartum visit in 6 weeks with the following provider: Any provider. ?Additional Postpartum F/U: Incision check 1 week  ?High risk pregnancy complicated by:  Hx of C/S x2 ?Delivery mode:  C-Section, Low Transverse  ?Anticipated Birth Control:  BTL done PP ? ?07/03/2021 ?Verita Schneiders, MD ? ? ? ?

## 2021-07-01 NOTE — Transfer of Care (Addendum)
Immediate Anesthesia Transfer of Care Note ? ?Patient: Hailey Watson ? ?Procedure(s) Performed: CESAREAN SECTION ? ?Patient Location: PACU ? ?Anesthesia Type:Spinal ? ?Level of Consciousness: awake, alert  and oriented ? ?Airway & Oxygen Therapy: Patient Spontanous Breathing ? ?Post-op Assessment: Report given to RN and Post -op Vital signs reviewed and stable ? ?Post vital signs: Reviewed and stable ? ?Last Vitals:  ?Vitals Value Taken Time  ?BP    ?Temp    ?Pulse    ?Resp    ?SpO2    ? ? ?Last Pain:  ?Vitals:  ? 07/01/21 1059  ?TempSrc: Oral  ?   ? ?  ? ?Complications: No notable events documented. ?

## 2021-07-01 NOTE — Anesthesia Postprocedure Evaluation (Signed)
Anesthesia Post Note ? ?Patient: Hailey Watson ? ?Procedure(s) Performed: CESAREAN SECTION ? ?  ? ?Patient location during evaluation: PACU ?Anesthesia Type: Spinal ?Level of consciousness: awake and alert ?Pain management: pain level controlled ?Vital Signs Assessment: post-procedure vital signs reviewed and stable ?Respiratory status: spontaneous breathing, nonlabored ventilation and respiratory function stable ?Cardiovascular status: blood pressure returned to baseline and stable ?Postop Assessment: no apparent nausea or vomiting ?Anesthetic complications: no ? ? ?No notable events documented. ? ?Last Vitals:  ?Vitals:  ? 07/01/21 1520 07/01/21 1530  ?BP: (!) 143/71 (!) 148/55  ?Pulse: (!) 52 (!) 51  ?Resp: (!) 24 17  ?Temp:  36.8 ?C  ?SpO2: 100% 100%  ?  ?Last Pain:  ?Vitals:  ? 07/01/21 1530  ?TempSrc: Oral  ?PainSc: (P) Asleep  ? ?Pain Goal: Patients Stated Pain Goal: 4 (07/01/21 1515) ? ?LLE Motor Response: Purposeful movement (07/01/21 1515) ?LLE Sensation: Tingling (07/01/21 1515) ?RLE Motor Response: Purposeful movement (07/01/21 1515) ?RLE Sensation: Tingling (07/01/21 1515) ?  ?  ?Epidural/Spinal Function Cutaneous sensation: (P) Tingles (07/01/21 1515), Patient able to flex knees: (P) Yes (07/01/21 1515), Patient able to lift hips off bed: (P) No (07/01/21 1515), Back pain beyond tenderness at insertion site: (P) No (07/01/21 1515), Progressively worsening motor and/or sensory loss: (P) No (07/01/21 1515), Bowel and/or bladder incontinence post epidural: (P) No (07/01/21 1515) ? ?[name] ? ? ? ? ?

## 2021-07-02 ENCOUNTER — Encounter (HOSPITAL_COMMUNITY): Payer: Self-pay | Admitting: Obstetrics & Gynecology

## 2021-07-02 LAB — COMPREHENSIVE METABOLIC PANEL
ALT: 146 U/L — ABNORMAL HIGH (ref 0–44)
AST: 103 U/L — ABNORMAL HIGH (ref 15–41)
Albumin: 2.3 g/dL — ABNORMAL LOW (ref 3.5–5.0)
Alkaline Phosphatase: 75 U/L (ref 38–126)
Anion gap: 6 (ref 5–15)
BUN: 5 mg/dL — ABNORMAL LOW (ref 6–20)
CO2: 21 mmol/L — ABNORMAL LOW (ref 22–32)
Calcium: 8.3 mg/dL — ABNORMAL LOW (ref 8.9–10.3)
Chloride: 107 mmol/L (ref 98–111)
Creatinine, Ser: 0.69 mg/dL (ref 0.44–1.00)
GFR, Estimated: >60 mL/min (ref >60)
Glucose, Bld: 87 mg/dL (ref 70–99)
Potassium: 4 mmol/L (ref 3.5–5.1)
Sodium: 134 mmol/L — ABNORMAL LOW (ref 135–145)
Total Bilirubin: 0.5 mg/dL (ref 0.3–1.2)
Total Protein: 5.5 g/dL — ABNORMAL LOW (ref 6.5–8.1)

## 2021-07-02 LAB — CBC
HCT: 31.1 % — ABNORMAL LOW (ref 36.0–46.0)
Hemoglobin: 10.5 g/dL — ABNORMAL LOW (ref 12.0–15.0)
MCH: 30.1 pg (ref 26.0–34.0)
MCHC: 33.8 g/dL (ref 30.0–36.0)
MCV: 89.1 fL (ref 80.0–100.0)
Platelets: 200 10*3/uL (ref 150–400)
RBC: 3.49 MIL/uL — ABNORMAL LOW (ref 3.87–5.11)
RDW: 15.4 % (ref 11.5–15.5)
WBC: 18.4 10*3/uL — ABNORMAL HIGH (ref 4.0–10.5)
nRBC: 0 % (ref 0.0–0.2)

## 2021-07-02 LAB — CREATININE, SERUM
Creatinine, Ser: 0.72 mg/dL (ref 0.44–1.00)
GFR, Estimated: >60 mL/min (ref >60)

## 2021-07-02 NOTE — Progress Notes (Signed)
The Rn called Dr Sherrine Maples regarding patient's pulse of 48.  No orders were given. ?

## 2021-07-02 NOTE — Lactation Note (Signed)
This note was copied from a baby's chart. ?Lactation Consultation Note ?Attempted to f/u w/mom but mom has Resting sign on door. Will ask RN to call for [facility] to see mom.  ?Patient Name: Hailey Watson ?Today's Date: 07/02/2021 ?  ?Age:33 hours ? ?Maternal Data ?  ? ?Feeding ?Nipple Type: Slow - flow ? ?LATCH Score ?  ? ?  ? ?  ? ?  ? ?  ? ?  ? ? ?Lactation Tools Discussed/Used ?  ? ?Interventions ?  ? ?Discharge ?  ? ?Consult Status ?  ? ? ? ?Theodoro Kalata ?07/02/2021, 8:28 PM ? ? ? ?

## 2021-07-02 NOTE — Clinical Social Work Maternal (Signed)
?CLINICAL SOCIAL WORK MATERNAL/CHILD NOTE ? ?Patient Details  ?Name: Hailey Watson ?MRN: [id number] ?Date of Birth: 06/07/1990 ? ?Date:  07/02/2021 ? ?Clinical Social Worker Initiating Note:  Tenishia Ekman, LCSW Date/Time: Initiated:  07/02/21/1223    ? ?Child's Name:  Elijah Leggett ? ?Biological Parents:  Mother, Father (Father: Byron Kent 01/09/93)  ? ?Need for Interpreter:  None  ? ?Reason for Referral:  Current Substance Use/Substance Use During Pregnancy    ? ?Address:  [address] ?[place] [address]  ? ?Phone number:  [phone] (home)    ? ?Additional phone number:  ? ?Household Members/Support Persons (HM/SP):   Household Member/Support Person 1 ? ? ?HM/SP Name Relationship DOB or Age  ?HM/SP -1  James Leggett FOB    ?HM/SP -2  Emani Bhat daughter  09/01/10  ?HM/SP -3  Emayah Leggett daughter  12/07/15  ?HM/SP -4        ?HM/SP -5        ?HM/SP -6        ?HM/SP -7        ?HM/SP -8        ? ? ?Natural Supports (not living in the home):   Parent, Spouse/significant other, Immediate Family ? ?Professional Supports: None  ? ?Employment: Full-time  ? 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 ?Education:  High school graduate  ? ?Homebound arranged:   ? ?Financial Resources:  Medicaid  ? ?Other Resources:   Food Stamps  ? ?Cultural/Religious Considerations Which May Impact Care:   ? ?Strengths:  Ability to meet basic needs  , Home prepared for child  , Pediatrician selected  ? ?Psychotropic Medications:        ? ?Pediatrician:      ? ?Pediatrician List:  ? ?[place]  [facility]  ?[place]    ?[place]    ?[place]    ?[place]    ?[place]    ? ? ?Pediatrician Fax Number:   ? ?Risk Factors/Current Problems:   Substance Use  , Transporation    ? ?Cognitive State:  Able to Concentrate  , Alert  , Goal Oriented  , Linear Thinking  , Insightful ? ?Mood/Affect:  Calm  , Interested  , Comfortable    ? ?CSW Assessment: CSW met with MOB  at bedside to complete psychosocial assessment, FOB present. MOB granted CSW verbal permission to speak in front of FOB about anything. CSW introduced self and explained reason for consult. MOB was welcoming, pleasant, and remained engaged during assessment. FOB also pleasant and participated in assessment at times. MOB reported that she resides with FOB and older daughters.  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 MOB reported that she has all items needed to care for infant including a car seat, play pen, and basinet. CSW inquired about MOB's support system, MOB reported that FOB, her mom, and brother,are supports.  ? ?CSW inquired about MOB's mental health history. MOB denied any mental health history. MOB denied any history of postpartum depression. CSW inquired about how MOB was feeling emotionally since giving birth, MOB reported that she was feeling excited. MOB presented calm and did not demonstrate any acute mental health signs/symptoms. CSW assessed for safety, MOB denied SI and HI. CSW did not assess for domestic violence as FOB was present.  ? ?CSW provided education regarding the baby blues period vs. perinatal mood disorders, discussed treatment and gave   resources for mental health follow up if concerns arise.  CSW recommends self-evaluation during the postpartum time period using the New Mom Checklist from Postpartum Progress and encouraged MOB to contact a medical professional if symptoms are noted at any time.   ? ?CSW provided review of Sudden Infant Death Syndrome (SIDS) precautions.   ? ?CSW informed MOB about the hospital drug screen due to documented substance use during pregnancy. MOB confirmed marijuana use and reported last use as one month ago. MOB reported that she used marijuana for appetite because she was unable to eat without it. MOB denied any other substance use. CSW informed MOB that infant's UDS was positive for THC and a CPS  report would be made. MOB verbalized understanding and denied any questions. MOB denied any CPS history.  ? ?CSW made a [place] [employer] CPS report due to infant's positive UDS for THC. CPS to follow up with family within 72 hours.  ? ?CSW identifies no further need for intervention and no barriers to discharge at this time. ? ? ?CSW Plan/Description:  No Further Intervention Required/No Barriers to Discharge, Sudden Infant Death Syndrome (SIDS) Education, Perinatal Mood and Anxiety Disorder (PMADs) Education, Hospital Drug Screen Policy Information, CSW Will Continue to Monitor Umbilical Cord Tissue Drug Screen Results and Make Report if Warranted, Child Protective Service Report    ? ? ?Merryl Buckels L Merric Yost, LCSW ?07/02/2021, 12:25 PM ?

## 2021-07-03 LAB — BPAM RBC
Blood Product Expiration Date: 202305042359
Blood Product Expiration Date: 202305042359
Unit Type and Rh: 6200
Unit Type and Rh: 6200

## 2021-07-03 LAB — TYPE AND SCREEN
ABO/RH(D): A POS
Antibody Screen: NEGATIVE
Unit division: 0
Unit division: 0

## 2021-07-03 MED ORDER — IBUPROFEN 600 MG PO TABS: 600.0000 mg | ORAL_TABLET | Freq: Four times a day (QID) | ORAL | 2 refills | Status: AC | PRN

## 2021-07-03 MED ORDER — SENNOSIDES-DOCUSATE SODIUM 8.6-50 MG PO TABS: 2.0000 | ORAL_TABLET | Freq: Two times a day (BID) | ORAL | 2 refills | Status: AC

## 2021-07-03 MED ORDER — OXYCODONE-ACETAMINOPHEN 5-325 MG PO TABS: 1.0000 | ORAL_TABLET | Freq: Four times a day (QID) | ORAL | 0 refills | Status: AC | PRN

## 2021-07-03 NOTE — Progress Notes (Signed)
Post Partum Day 1 ?Subjective: ? ?Hailey Watson is a 31 y.o. [id number] [redacted]w[redacted]d s/p RLTCS.  No acute events overnight.  Pt denies problems with ambulating, voiding or po intake.  She denies nausea or vomiting.  Pain is well controlled.  She has had flatus.  Lochia Minimal.  Plan for birth control is bilateral tubal ligation.  Method of Feeding: breast ? ?Objective: ?Blood pressure (!) 119/49, pulse (!) 59, temperature 98 ?F (36.7 ?C), temperature source Oral, resp. rate 18, height 4\' 6"  (1.372 m), weight 82.6 kg, last menstrual period 10/01/2020, SpO2 100 %. ? ?Physical Exam:  ?General: alert, cooperative and no distress ?Lochia:normal flow ?Chest: normal WOB ?Heart: Regular rate ?Abdomen: +BS, soft, mild TTP (appropriate) ?Uterine Fundus: firm, below umbilicus ?DVT Evaluation: No evidence of DVT seen on physical exam. ?Extremities: trace edema ? ?Recent Labs  ?  07/02/21 ?0518  ?HGB 10.5*  ?HCT 31.1*  ? ? ?Assessment/Plan: ? ?ASSESSMENT: Hailey Watson is a 31 y.o. 34 [redacted]w[redacted]d s/p rLTCS with salpingectomy ? ?Plan for discharge tomorrow, Breastfeeding, and Circumcision prior to discharge ?Continue routine PP care ?Breastfeeding support PRN ? ?# BP: had one elevated blood pressure. Monitor closely.  ? ? LOS: 2 days  ? ?[redacted]w[redacted]d Hailey Watson ?07/03/2021, 11:07 AM  ? ?

## 2021-07-03 NOTE — Lactation Note (Signed)
This note was copied from a baby's chart. ?Lactation Consultation Note ? ?Patient Name: Hailey Watson ?Today's Date: 07/03/2021 ?Reason for consult: Follow-up assessment ?Age:32 hours ? ?P3, Mother states she is breastfeeding and formula feeding.  She pumped 10 ml with manual pump this morning.  She denies needing assistance with latching. ?Encouraged mother to contact WIC regarding DEBP. ?Reviewed engorgement care and monitoring voids/stools. ?Feed on demand with cues.  Goal 8-12+ times per day after first 24 hrs.  Place baby STS if not cueing.  ? ? ?Maternal Data ?Has patient been taught Hand Expression?: Yes ? ?Feeding ?Mother's Current Feeding Choice: Breast Milk and Formula ? ? ? ?Lactation Tools Discussed/Used ?Tools: Pump ?Breast pump type: Manual ?Pump Education: Setup, frequency, and cleaning;Milk Storage ?Pumped volume: 10 mL ? ?Interventions ?Interventions: Education;Hand pump ? ?Discharge ?Discharge Education: Engorgement and breast care;Warning signs for feeding baby ? ?Consult Status ?Consult Status: Complete ?Date: 07/03/21 ? ? ? ?Dahlia Byes Boschen ?07/03/2021, 9:30 AM ? ? ? ?

## 2021-07-03 NOTE — Plan of Care (Signed)
?  Problem: Activity: Goal: Will verbalize the importance of balancing activity with adequate rest periods Outcome: Completed/Met Goal: Ability to tolerate increased activity will improve Outcome: Completed/Met   Problem: Life Cycle: Goal: Chance of risk for complications during the postpartum period will decrease Outcome: Completed/Met   Problem: Role Relationship: Goal: Ability to demonstrate positive interaction with newborn will improve Outcome: Completed/Met   Problem: Skin Integrity: Goal: Demonstration of wound healing without infection will improve Outcome: Completed/Met   

## 2021-07-08 ENCOUNTER — Ambulatory Visit: Payer: Medicaid Other

## 2021-07-13 ENCOUNTER — Telehealth (HOSPITAL_COMMUNITY): Payer: Self-pay | Admitting: *Deleted

## 2021-07-13 NOTE — Telephone Encounter (Signed)
Mom reports feeling good. No concerns about herself at this time. EPDS=0 [facility] score=0) ?Mom reports baby is doing well. Mom reports no concerns about baby at present. ? ?Duffy Rhody, RN 07-13-2021 at 1:50pm ?

## 2021-07-26 ENCOUNTER — Other Ambulatory Visit: Payer: Self-pay | Admitting: Obstetrics & Gynecology

## 2021-08-04 ENCOUNTER — Ambulatory Visit: Payer: Self-pay | Admitting: Obstetrics and Gynecology

## 2021-09-05 ENCOUNTER — Telehealth: Payer: Self-pay | Admitting: Family Medicine

## 2021-09-05 NOTE — Telephone Encounter (Signed)
Called to confirm appt for tomorrow

## 2021-09-06 ENCOUNTER — Ambulatory Visit: Payer: Medicaid Other | Admitting: Certified Nurse Midwife

## 2021-09-07 ENCOUNTER — Encounter: Payer: Self-pay | Admitting: General Practice

## 2021-10-30 ENCOUNTER — Emergency Department (HOSPITAL_COMMUNITY)
Admission: EM | Admit: 2021-10-30 | Discharge: 2021-10-30 | Disposition: A | Discharge status: Home / Self Care | Payer: Medicaid Other | Attending: Emergency Medicine | Admitting: Emergency Medicine

## 2021-10-30 ENCOUNTER — Encounter (HOSPITAL_COMMUNITY): Payer: Self-pay | Admitting: Emergency Medicine

## 2021-10-30 ENCOUNTER — Other Ambulatory Visit: Payer: Self-pay

## 2021-10-30 DIAGNOSIS — R109 Unspecified abdominal pain: Secondary | ICD-10-CM | POA: Insufficient documentation

## 2021-10-30 DIAGNOSIS — R197 Diarrhea, unspecified: Secondary | ICD-10-CM | POA: Diagnosis not present

## 2021-10-30 DIAGNOSIS — Z77098 Contact with and (suspected) exposure to other hazardous, chiefly nonmedicinal, chemicals: Secondary | ICD-10-CM | POA: Diagnosis not present

## 2021-10-30 NOTE — ED Notes (Signed)
MD at bedside. 
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
## 2021-10-30 NOTE — ED Notes (Signed)
Mother awake alert and appropriate. NAD noted. MD evaluated. Poison control aware of patient and daughter

## 2021-10-30 NOTE — Discharge Instructions (Addendum)
Follow-up with please as directed. Return for new or worsening signs or symptoms.  Stay well-hydrated with water.

## 2021-12-15 NOTE — Telephone Encounter (Signed)
Open in error

## 2022-10-02 IMAGING — US US MFM FETAL BPP W/O NON-STRESS
1 series · 12 of 17 positions shown · non-contrast
Comparison: none

[Series 1: us mfm fetal bpp w/o non-stress · 17 acquisitions, 12 frames shown]
[im 1/17]
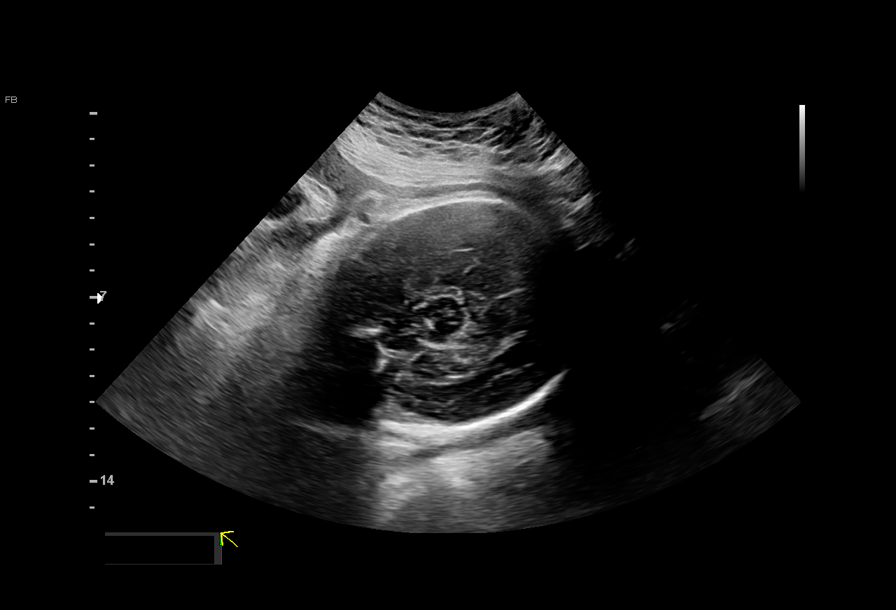
[im 3/17]
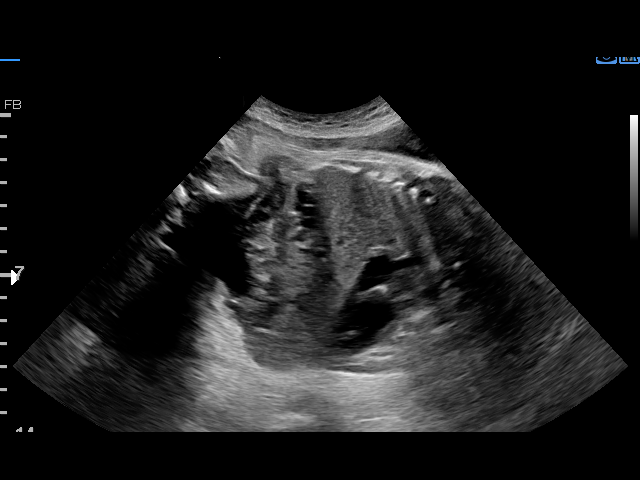
[im 4/17]
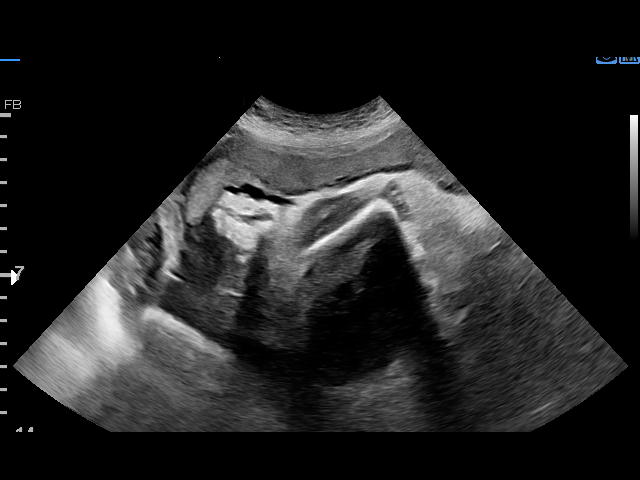
[im 5/17]
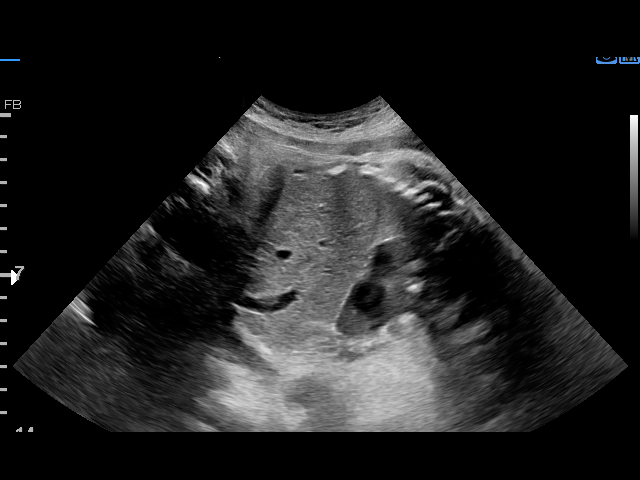
[im 7/17]
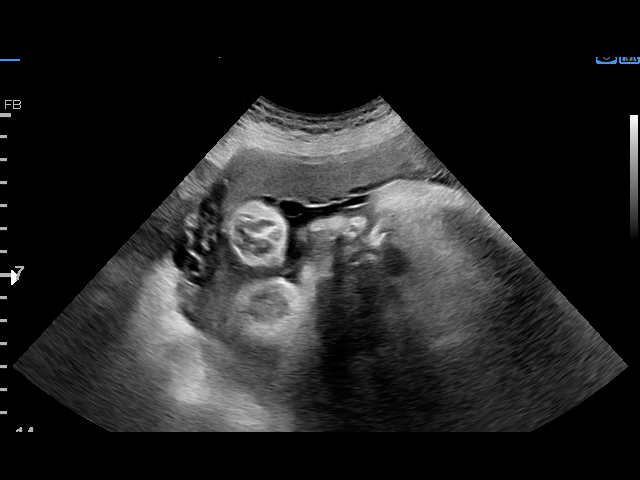
[im 8/17]
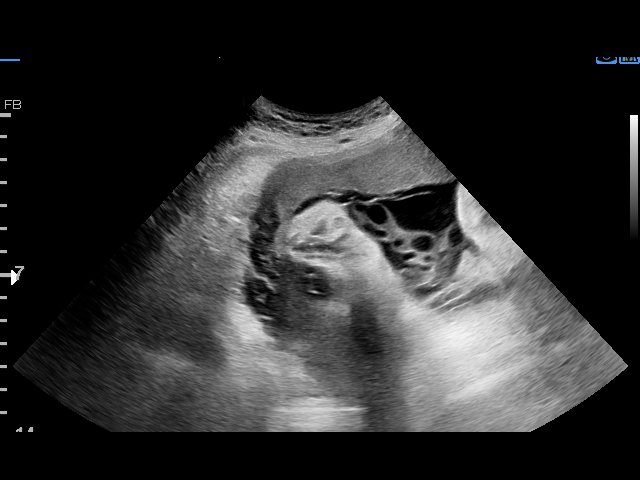
[im 10/17]
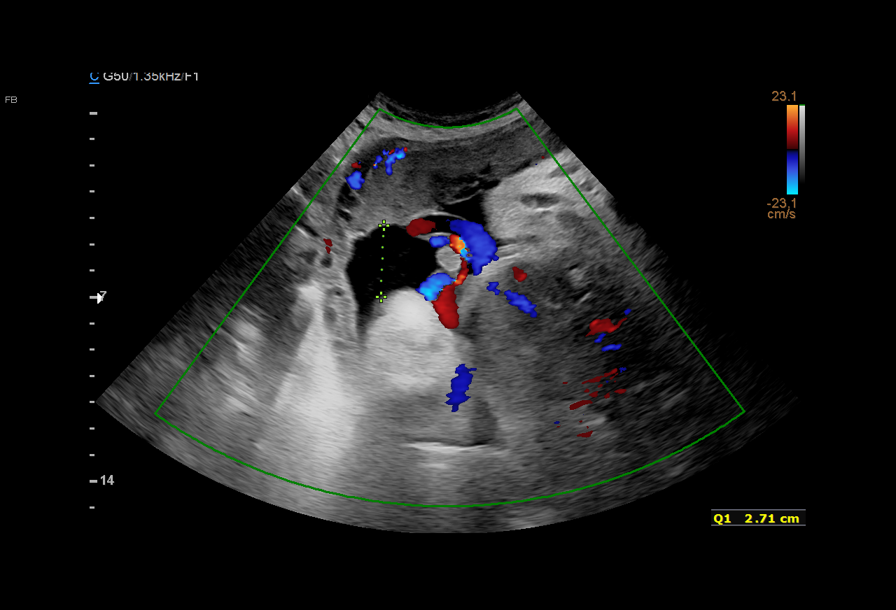
[im 11/17]
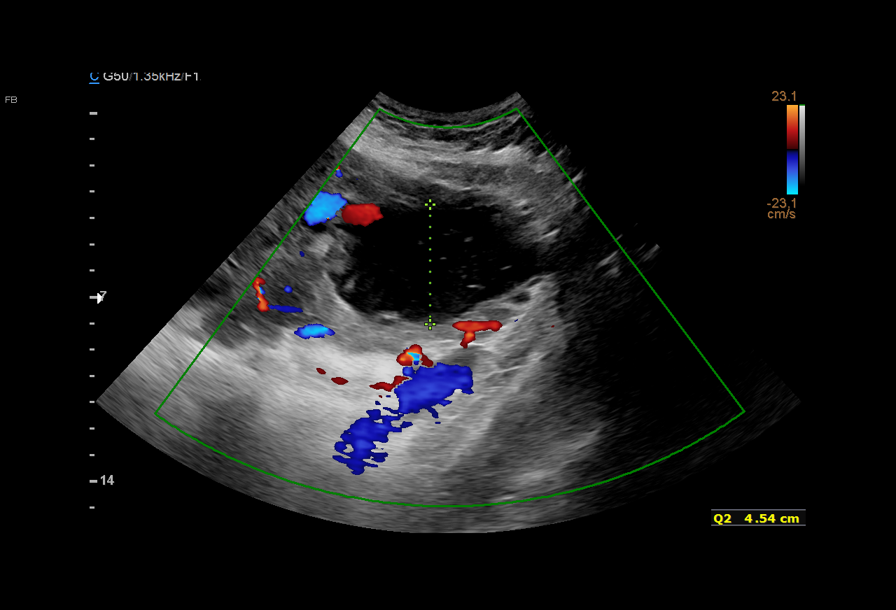
[im 13/17]
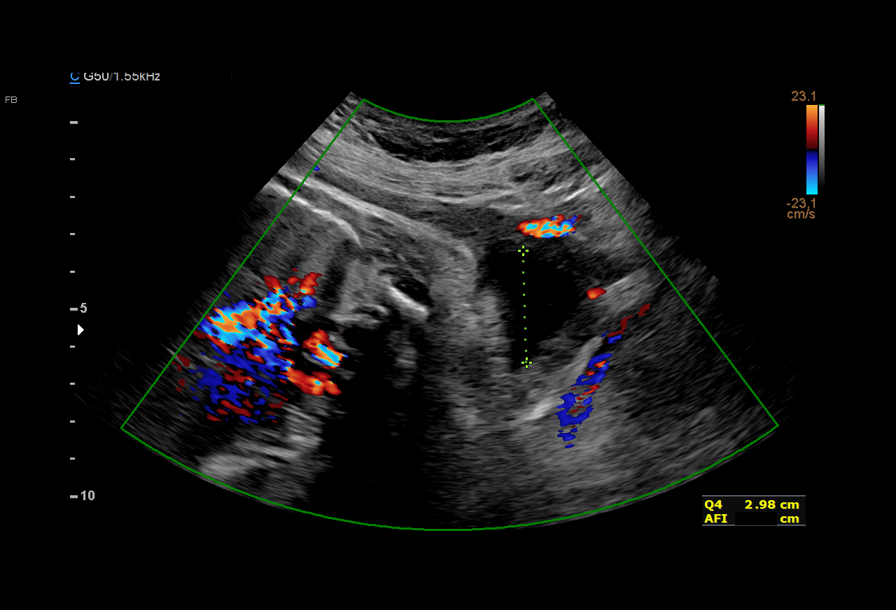
[im 14/17]
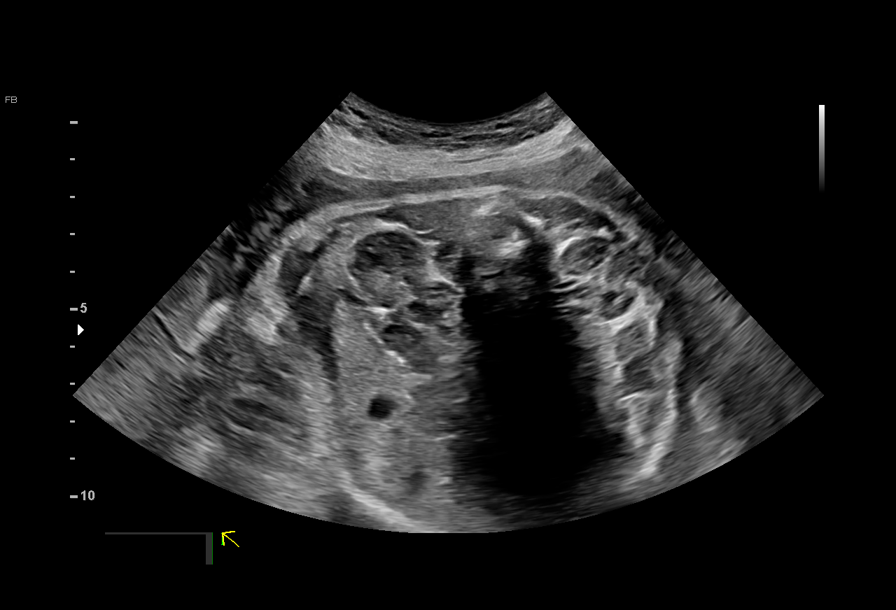
[im 15/17]
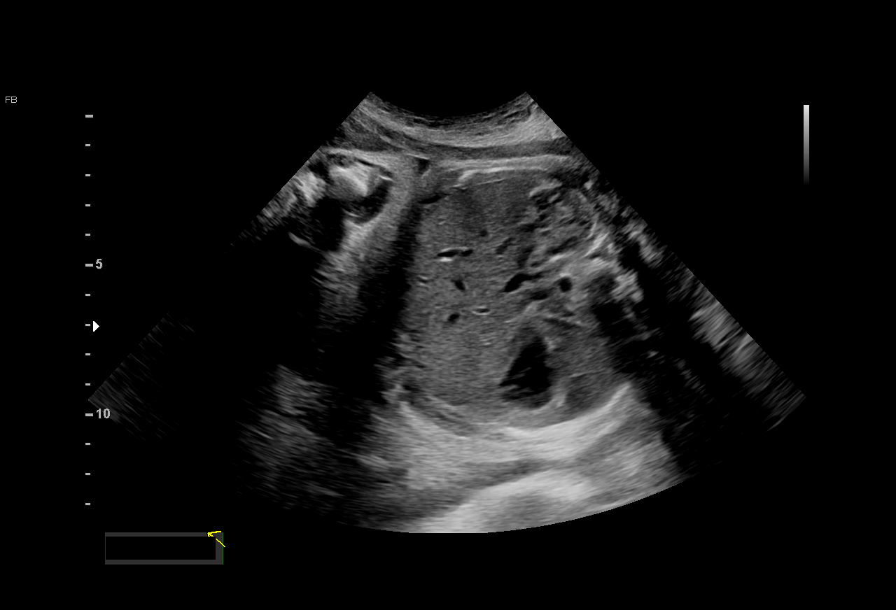
[im 17/17]
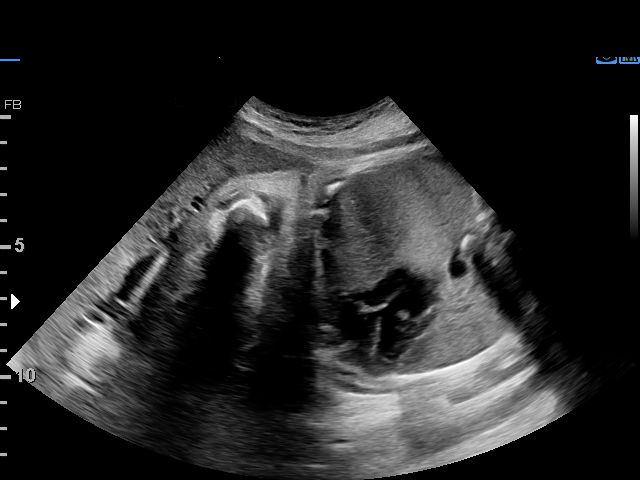

[12 of 17 positions shown; findings below may reference images not displayed]

[facility] [HOSPITAL]
                   YUUKA CNM

Indications

 Obesity complicating pregnancy, second
 trimester BMI 40
 LR NIPS/AFP-Neg/Horizon-Neg
 History of cesarean delivery, currently
 pregnant
 36 weeks gestation of pregnancy
Fetal Evaluation

 Num Of Fetuses:         1
 Fetal Heart Rate(bpm):  140
 Cardiac Activity:       Observed
 Presentation:           Cephalic
 Placenta:               Anterior
 P. Cord Insertion:      Previously Visualized

 Amniotic Fluid
 AFI FV:      Within normal limits

 AFI Sum(cm)     %Tile       Largest Pocket(cm)
 11.84           36

 RUQ(cm)       RLQ(cm)       LUQ(cm)        LLQ(cm)

Biophysical Evaluation
 Amniotic F.V:   Pocket => 2 cm             F. Tone:        Observed
 F. Movement:    Observed                   Score:          [DATE]
 F. Breathing:   Observed
OB History

 Gravidity:    5         Term:   2         SAB:   2
 Living:       2
Gestational Age

 LMP:           36w 3d        Date:  10/01/20                 EDD:   07/08/21
 Best:          36w 3d     Det. By:  LMP  (10/01/20)          EDD:   07/08/21
Anatomy

 Stomach:               Appears normal, left   Bladder:                Appears normal
                        sided
 Kidneys:               Appear normal
Impression

 Prepregnancy BMI 40.
 Amniotic fluid is normal and good fetal activity seen.
 Antenatal testing is reassuring.  BPP [DATE].  Cephalic
 presentation.
Recommendations

 Continue weekly BPP till delivery.
                 [place], [place]
